# Patient Record
Sex: Female | Born: 1965 | Race: Black or African American | Hispanic: No | Marital: Single | Smoking: Never smoker
Health system: Southern US, Community
[De-identification: ages and names within clinical notes are randomized; demographics above are authoritative.]

## PROBLEM LIST (undated history)

## (undated) DIAGNOSIS — J449 Chronic obstructive pulmonary disease, unspecified: Secondary | ICD-10-CM

## (undated) DIAGNOSIS — I10 Essential (primary) hypertension: Secondary | ICD-10-CM

## (undated) DIAGNOSIS — M109 Gout, unspecified: Secondary | ICD-10-CM

## (undated) DIAGNOSIS — G4730 Sleep apnea, unspecified: Secondary | ICD-10-CM

## (undated) DIAGNOSIS — J45909 Unspecified asthma, uncomplicated: Secondary | ICD-10-CM

## (undated) DIAGNOSIS — G629 Polyneuropathy, unspecified: Secondary | ICD-10-CM

## (undated) DIAGNOSIS — I639 Cerebral infarction, unspecified: Secondary | ICD-10-CM

## (undated) DIAGNOSIS — K219 Gastro-esophageal reflux disease without esophagitis: Secondary | ICD-10-CM

## (undated) DIAGNOSIS — E119 Type 2 diabetes mellitus without complications: Secondary | ICD-10-CM

## (undated) DIAGNOSIS — N3281 Overactive bladder: Secondary | ICD-10-CM

## (undated) HISTORY — PX: MOUTH SURGERY: SHX715

---

## 2001-11-22 ENCOUNTER — Emergency Department (HOSPITAL_COMMUNITY): Admission: EM | Admit: 2001-11-22 | Discharge: 2001-11-22 | Payer: Self-pay | Admitting: *Deleted

## 2004-08-29 ENCOUNTER — Inpatient Hospital Stay: Payer: Self-pay | Admitting: Anesthesiology

## 2004-08-29 ENCOUNTER — Other Ambulatory Visit: Payer: Self-pay

## 2004-09-07 ENCOUNTER — Ambulatory Visit: Payer: Self-pay

## 2005-04-19 ENCOUNTER — Emergency Department: Payer: Self-pay | Admitting: Emergency Medicine

## 2005-07-15 ENCOUNTER — Other Ambulatory Visit: Payer: Self-pay

## 2005-07-15 ENCOUNTER — Inpatient Hospital Stay: Payer: Self-pay | Admitting: Internal Medicine

## 2005-07-16 ENCOUNTER — Other Ambulatory Visit: Payer: Self-pay

## 2005-08-15 ENCOUNTER — Ambulatory Visit: Payer: Self-pay | Admitting: Specialist

## 2005-08-26 ENCOUNTER — Emergency Department: Payer: Self-pay | Admitting: Emergency Medicine

## 2005-09-06 ENCOUNTER — Emergency Department: Payer: Self-pay | Admitting: Emergency Medicine

## 2005-10-04 ENCOUNTER — Ambulatory Visit: Payer: Self-pay | Admitting: Family Medicine

## 2005-12-01 ENCOUNTER — Inpatient Hospital Stay: Payer: Self-pay | Admitting: Internal Medicine

## 2005-12-01 ENCOUNTER — Other Ambulatory Visit: Payer: Self-pay

## 2005-12-23 ENCOUNTER — Emergency Department: Payer: Self-pay | Admitting: Emergency Medicine

## 2005-12-23 ENCOUNTER — Other Ambulatory Visit: Payer: Self-pay

## 2006-03-14 ENCOUNTER — Emergency Department: Payer: Self-pay | Admitting: Emergency Medicine

## 2006-03-14 ENCOUNTER — Other Ambulatory Visit: Payer: Self-pay

## 2006-03-25 ENCOUNTER — Emergency Department: Payer: Self-pay | Admitting: Unknown Physician Specialty

## 2006-11-21 IMAGING — CR DG CHEST 1V PORT
1 series · 1 of 1 positions shown · non-contrast
Comparison: none

REASON FOR EXAM: pain
COMMENTS:

PROCEDURE:     DXR - DXR PORTABLE CHEST SINGLE VIEW  - December 23, 2005  [DATE]
RESULT:          The lungs are clear.  The cardiovascular structures are
unremarkable.

[view not recorded]
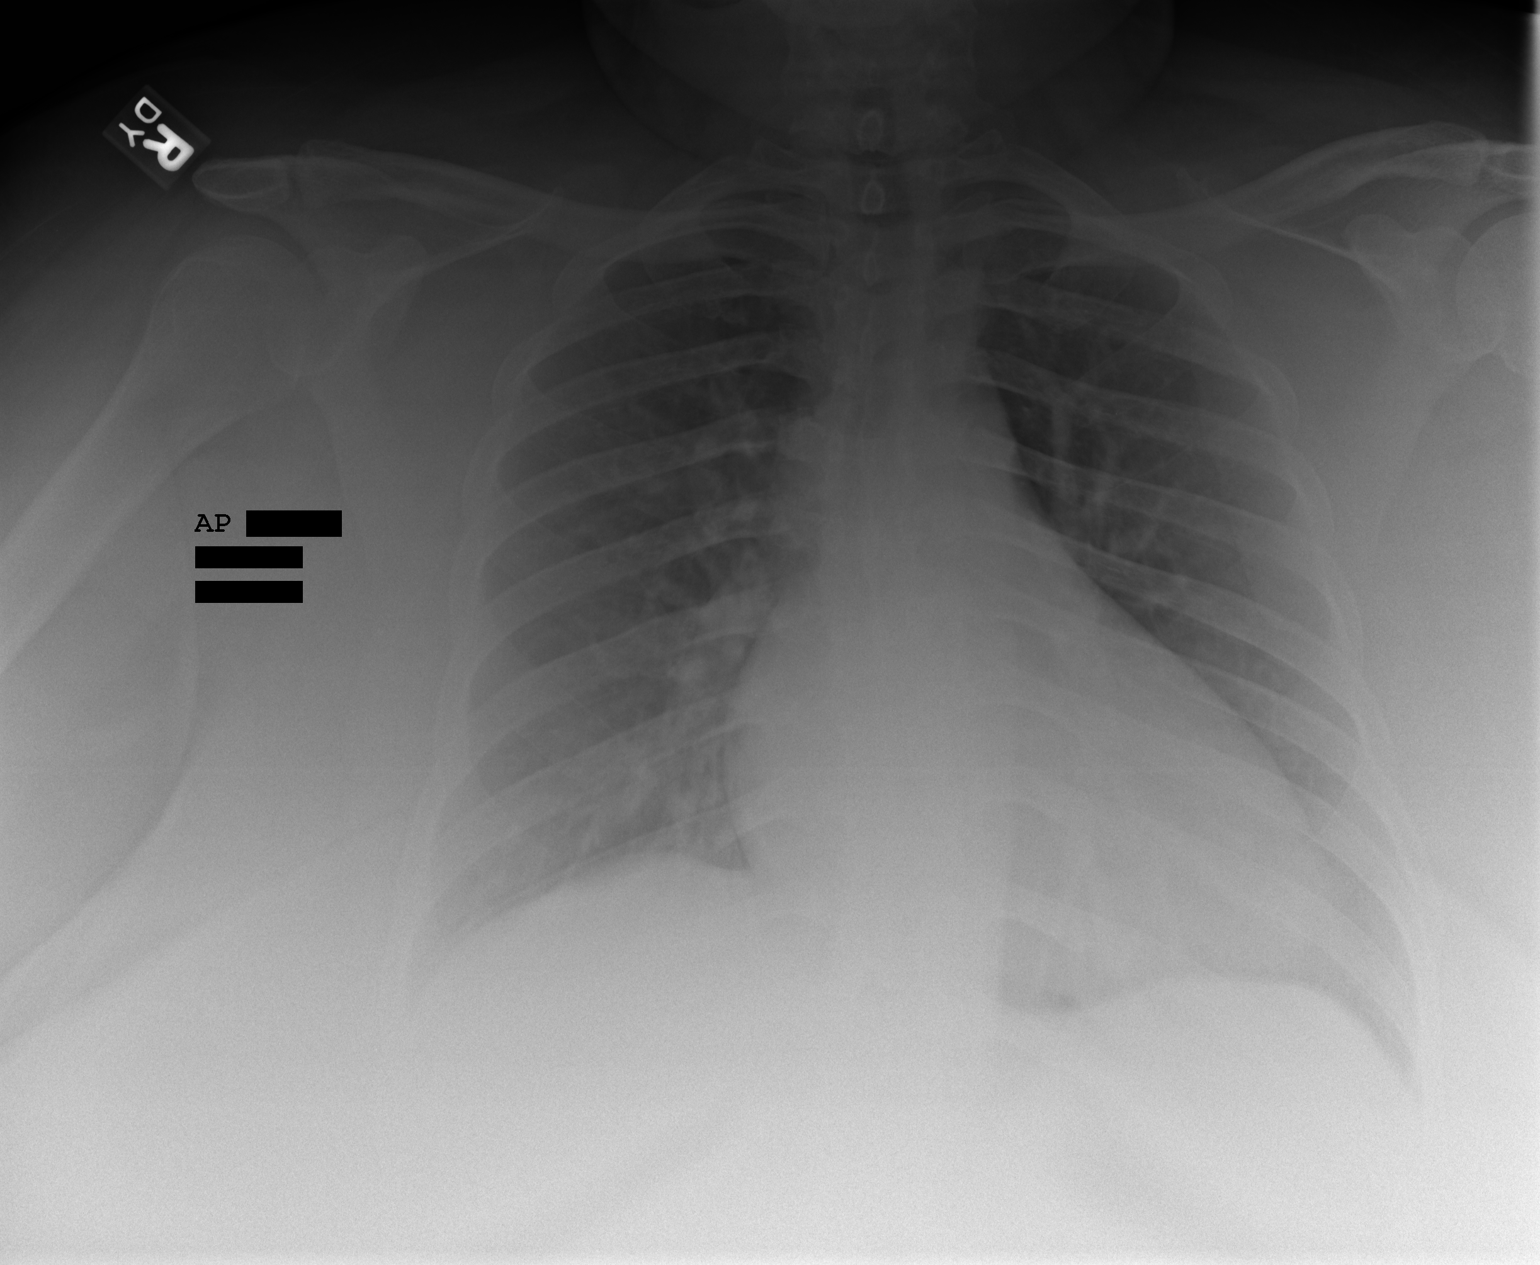

[1 of 1 positions shown; findings below may reference images not displayed]

IMPRESSION: No acute cardiopulmonary disease.

## 2006-12-18 IMAGING — US US OUTSIDE FILMS BREAST
1 series · 5 of 5 positions shown · non-contrast
Comparison: none

[Series 1: us outside films breast · 5 of 5 slices shown]
[im 1/5]
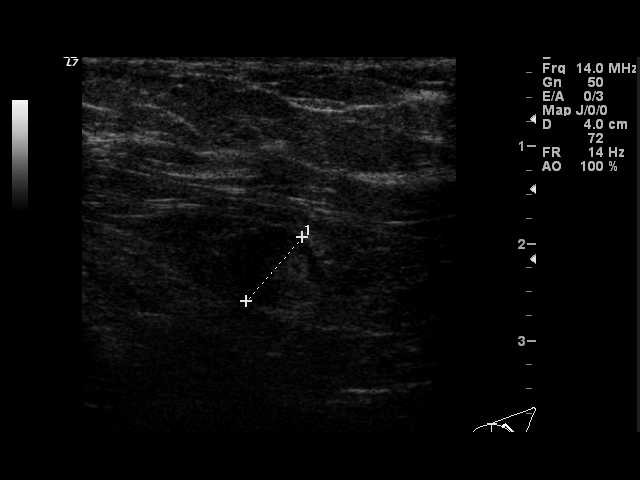
[im 2/5]
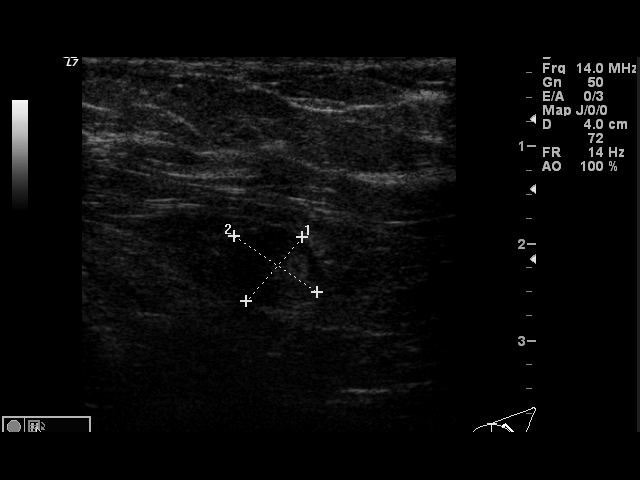
[im 3/5]
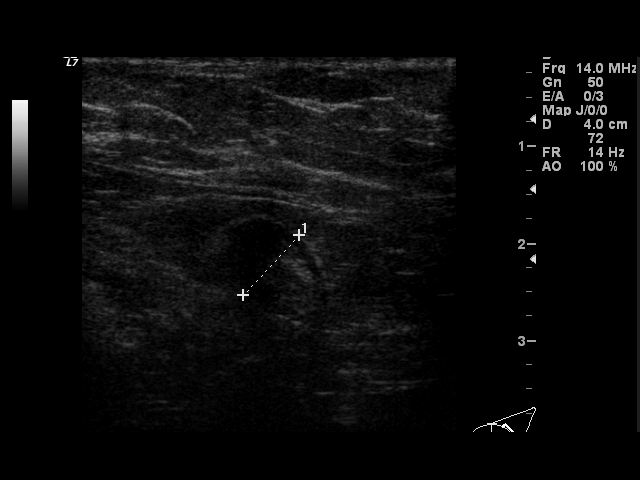
[im 4/5]
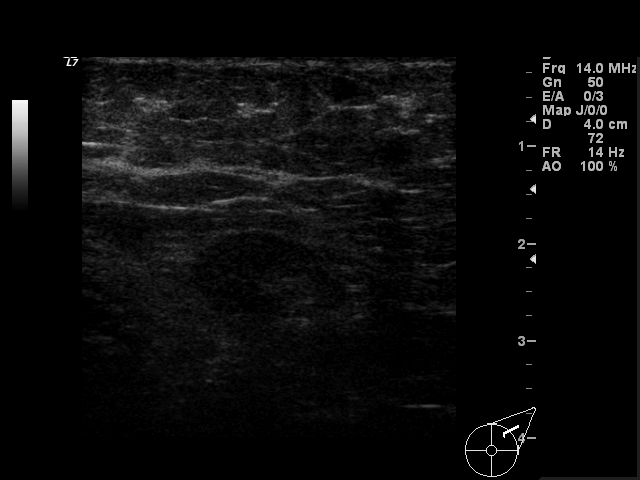
[im 5/5]
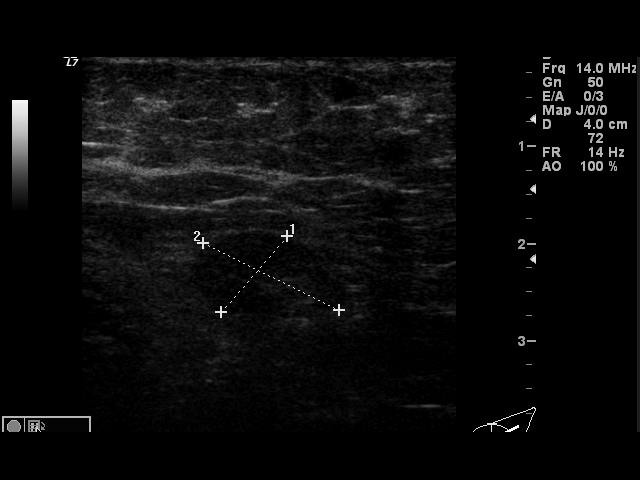

[5 of 5 positions shown; findings below may reference images not displayed]

IMAGES IMPORTED FROM THE [employer] WORKFLOW SYSTEM
NO DICTATION FOR STUDY

## 2007-01-30 ENCOUNTER — Emergency Department: Payer: Self-pay | Admitting: Emergency Medicine

## 2007-02-04 ENCOUNTER — Emergency Department: Payer: Self-pay | Admitting: Emergency Medicine

## 2007-02-04 ENCOUNTER — Other Ambulatory Visit: Payer: Self-pay

## 2007-06-25 ENCOUNTER — Emergency Department: Payer: Self-pay | Admitting: Emergency Medicine

## 2007-06-25 ENCOUNTER — Other Ambulatory Visit: Payer: Self-pay

## 2007-06-27 ENCOUNTER — Ambulatory Visit: Payer: Self-pay | Admitting: Internal Medicine

## 2007-12-26 ENCOUNTER — Ambulatory Visit: Payer: Self-pay | Admitting: Internal Medicine

## 2008-02-08 ENCOUNTER — Emergency Department: Payer: Self-pay | Admitting: Unknown Physician Specialty

## 2008-03-16 ENCOUNTER — Ambulatory Visit: Payer: Self-pay

## 2009-02-01 ENCOUNTER — Emergency Department: Payer: Self-pay | Admitting: Emergency Medicine

## 2010-02-22 ENCOUNTER — Observation Stay: Payer: Self-pay | Admitting: Internal Medicine

## 2010-02-22 ENCOUNTER — Ambulatory Visit: Payer: Self-pay | Admitting: Cardiovascular Disease

## 2010-02-24 ENCOUNTER — Encounter: Payer: Self-pay | Admitting: Cardiovascular Disease

## 2010-04-07 ENCOUNTER — Emergency Department: Payer: Self-pay | Admitting: Emergency Medicine

## 2010-04-08 ENCOUNTER — Emergency Department: Payer: Self-pay | Admitting: Emergency Medicine

## 2010-08-23 NOTE — Letter (Signed)
Summary: Discharge Summary  Discharge Summary   Imported By: [place] Carbo 02/25/2010 09:20:37  _____________________________________________________________________  External Attachment:    Type:   Image     Comment:   External Document

## 2010-09-01 ENCOUNTER — Emergency Department: Payer: Self-pay | Admitting: Emergency Medicine

## 2011-08-15 ENCOUNTER — Ambulatory Visit: Payer: Self-pay | Admitting: Family Medicine

## 2012-12-18 ENCOUNTER — Ambulatory Visit: Payer: Self-pay

## 2013-07-28 ENCOUNTER — Ambulatory Visit: Payer: Self-pay | Admitting: Internal Medicine

## 2013-11-09 DIAGNOSIS — G4733 Obstructive sleep apnea (adult) (pediatric): Secondary | ICD-10-CM | POA: Insufficient documentation

## 2013-11-09 DIAGNOSIS — J45909 Unspecified asthma, uncomplicated: Secondary | ICD-10-CM | POA: Insufficient documentation

## 2014-07-29 ENCOUNTER — Ambulatory Visit: Payer: Self-pay | Admitting: Internal Medicine

## 2015-11-17 ENCOUNTER — Other Ambulatory Visit: Payer: Self-pay | Admitting: Internal Medicine

## 2015-11-17 DIAGNOSIS — Z1231 Encounter for screening mammogram for malignant neoplasm of breast: Secondary | ICD-10-CM

## 2015-11-25 ENCOUNTER — Ambulatory Visit
Admission: RE | Admit: 2015-11-25 | Discharge: 2015-11-25 | Disposition: A | Discharge status: Home / Self Care | Payer: Medicare Other | Source: Ambulatory Visit | Attending: Internal Medicine | Admitting: Internal Medicine

## 2015-11-25 DIAGNOSIS — Z1231 Encounter for screening mammogram for malignant neoplasm of breast: Secondary | ICD-10-CM | POA: Diagnosis not present

## 2017-04-06 ENCOUNTER — Emergency Department: Payer: Medicare Other

## 2017-04-06 ENCOUNTER — Encounter: Payer: Self-pay | Admitting: Emergency Medicine

## 2017-04-06 ENCOUNTER — Emergency Department
Admission: EM | Admit: 2017-04-06 | Discharge: 2017-04-07 | Disposition: A | Discharge status: Other Acute Inpatient Hospital | Payer: Medicare Other | Attending: Emergency Medicine | Admitting: Emergency Medicine

## 2017-04-06 DIAGNOSIS — J45909 Unspecified asthma, uncomplicated: Secondary | ICD-10-CM | POA: Diagnosis not present

## 2017-04-06 DIAGNOSIS — I10 Essential (primary) hypertension: Secondary | ICD-10-CM | POA: Insufficient documentation

## 2017-04-06 DIAGNOSIS — R079 Chest pain, unspecified: Secondary | ICD-10-CM

## 2017-04-06 DIAGNOSIS — J449 Chronic obstructive pulmonary disease, unspecified: Secondary | ICD-10-CM | POA: Diagnosis not present

## 2017-04-06 HISTORY — DX: Gout, unspecified: M10.9

## 2017-04-06 HISTORY — DX: Chronic obstructive pulmonary disease, unspecified: J44.9

## 2017-04-06 HISTORY — DX: Essential (primary) hypertension: I10

## 2017-04-06 HISTORY — DX: Sleep apnea, unspecified: G47.30

## 2017-04-06 HISTORY — DX: Unspecified asthma, uncomplicated: J45.909

## 2017-04-06 LAB — CBC WITH DIFFERENTIAL/PLATELET
BASOS PCT: 1 %
Basophils Absolute: 0.1 10*3/uL (ref 0–0.1)
Eosinophils Absolute: 0.1 10*3/uL (ref 0–0.7)
Eosinophils Relative: 1 %
HEMATOCRIT: 34.4 % — AB (ref 35.0–47.0)
HEMOGLOBIN: 11.6 g/dL — AB (ref 12.0–16.0)
LYMPHS ABS: 2.2 10*3/uL (ref 1.0–3.6)
LYMPHS PCT: 28 %
MCH: 31.8 pg (ref 26.0–34.0)
MCHC: 33.8 g/dL (ref 32.0–36.0)
MCV: 93.9 fL (ref 80.0–100.0)
MONO ABS: 0.5 10*3/uL (ref 0.2–0.9)
MONOS PCT: 6 %
NEUTROS ABS: 5.2 10*3/uL (ref 1.4–6.5)
NEUTROS PCT: 64 %
Platelets: 216 10*3/uL (ref 150–440)
RBC: 3.66 MIL/uL — ABNORMAL LOW (ref 3.80–5.20)
RDW: 14.2 % (ref 11.5–14.5)
WBC: 8 10*3/uL (ref 3.6–11.0)

## 2017-04-06 LAB — COMPREHENSIVE METABOLIC PANEL
ALBUMIN: 3.2 g/dL — AB (ref 3.5–5.0)
ALK PHOS: 81 U/L (ref 38–126)
ALT: 11 U/L — ABNORMAL LOW (ref 14–54)
ANION GAP: 7 (ref 5–15)
AST: 19 U/L (ref 15–41)
BILIRUBIN TOTAL: 0.3 mg/dL (ref 0.3–1.2)
BUN: 10 mg/dL (ref 6–20)
CALCIUM: 8.7 mg/dL — AB (ref 8.9–10.3)
CO2: 27 mmol/L (ref 22–32)
Chloride: 103 mmol/L (ref 101–111)
Creatinine, Ser: 0.66 mg/dL (ref 0.44–1.00)
GFR calc Af Amer: >60 mL/min (ref >60)
GLUCOSE: 137 mg/dL — AB (ref 65–99)
POTASSIUM: 4.5 mmol/L (ref 3.5–5.1)
Sodium: 137 mmol/L (ref 135–145)
Total Protein: 7.3 g/dL (ref 6.5–8.1)

## 2017-04-06 LAB — TROPONIN I

## 2017-04-06 LAB — FIBRIN DERIVATIVES D-DIMER (ARMC ONLY): Fibrin derivatives D-dimer (ARMC): 500.9 — ABNORMAL HIGH (ref 0.00–499.00)

## 2017-04-06 MED ORDER — MORPHINE SULFATE (PF) 4 MG/ML IV SOLN
4.0000 mg | Freq: Once | INTRAVENOUS | Status: AC
  Administered 2017-04-06: 4 mg via INTRAVENOUS
  Filled 2017-04-06: qty 1

## 2017-04-06 MED ORDER — IPRATROPIUM-ALBUTEROL 0.5-2.5 (3) MG/3ML IN SOLN
3.0000 mL | Freq: Once | RESPIRATORY_TRACT | Status: AC
  Administered 2017-04-06: 3 mL via RESPIRATORY_TRACT
  Filled 2017-04-06: qty 3

## 2017-04-06 MED ORDER — ONDANSETRON HCL 4 MG/2ML IJ SOLN
4.0000 mg | Freq: Once | INTRAMUSCULAR | Status: AC
  Administered 2017-04-06: 4 mg via INTRAVENOUS
  Filled 2017-04-06: qty 2

## 2017-04-06 MED ORDER — IOPAMIDOL (ISOVUE-370) INJECTION 76%
100.0000 mL | Freq: Once | INTRAVENOUS | Status: AC | PRN
  Administered 2017-04-06: 100 mL via INTRAVENOUS

## 2017-04-06 NOTE — ED Provider Notes (Signed)
[facility] Emergency Department Provider Note    First MD Initiated Contact with Patient 04/06/17 1930     (approximate)  I have reviewed the triage vital signs and the nursing notes.   HISTORY  Chief Complaint Chest Pain    HPI Caitlin Hardin is a 51 y.o. female with history of COPD and asthma presents with chief complaint of chest pain that started just after dinner tonight. Patient states the pain is in the middle of her chest and nonradiating. Denies any shortness of breath. No cough. No nausea or vomiting. No fevers. No previous history of pain like this before. Does have a history of hypertension and gout. Lower extremity swelling. Actually denies any chest pain at this moment but when palpating her chest she states that she does have pain at that time.   Past Medical History:  Diagnosis Date  . Asthma   . COPD (chronic obstructive pulmonary disease) (HCC)   . Gout   . Hypertension   . Sleep apnea    Family History  Problem Relation Age of Onset  . Breast cancer Paternal Grandmother    History reviewed. No pertinent surgical history. There are no active problems to display for this patient.     Prior to Admission medications   Not on File    Allergies Patient has no allergy information on record.    Social History Social History  Substance Use Topics  . Smoking status: Never Smoker  . Smokeless tobacco: Never Used  . Alcohol use No    Review of Systems Patient denies headaches, rhinorrhea, blurry vision, numbness, shortness of breath, chest pain, edema, cough, abdominal pain, nausea, vomiting, diarrhea, dysuria, fevers, rashes or hallucinations unless otherwise stated above in HPI. ____________________________________________   PHYSICAL EXAM:  VITAL SIGNS: Vitals:   04/06/17 1940 04/06/17 2030  BP: (!) 118/49 (!) 116/54  Pulse: 65 65  Resp: 16 16  Temp: 98.3 F (36.8 C)   SpO2: 98% 96%    Constitutional:  Alert and oriented. Well appearing and in no acute distress. Eyes: Conjunctivae are normal.  Head: Atraumatic. Nose: No congestion/rhinnorhea. Mouth/Throat: Mucous membranes are moist.   Neck: No stridor. Painless ROM.  Cardiovascular: Normal rate, regular rhythm. Grossly normal heart sounds.  Good peripheral circulation. Respiratory: Normal respiratory effort.  No retractions. Lungs with intermittent wheeze Gastrointestinal: Soft and nontender. No distention. No abdominal bruits. No CVA tenderness. Musculoskeletal: No lower extremity tenderness nor edema.  No joint effusions. Neurologic:   No gross focal neurologic deficits are appreciated. No facial droop Skin:  Skin is warm, dry and intact. No rash noted. Psychiatric: Mood and affect are normal. Speech and behavior are normal.   ____________________________________________   LABS (all labs ordered are listed, but only abnormal results are displayed)  Results for orders placed or performed during the hospital encounter of 04/06/17 (from the past 24 hour(s))  CBC with Differential/Platelet     Status: Abnormal   Collection Time: 04/06/17  7:57 PM  Result Value Ref Range   WBC 8.0 3.6 - 11.0 K/uL   RBC 3.66 (L) 3.80 - 5.20 MIL/uL   Hemoglobin 11.6 (L) 12.0 - 16.0 g/dL   HCT 16.1 (L) 09.6 - 04.5 %   MCV 93.9 80.0 - 100.0 fL   MCH 31.8 26.0 - 34.0 pg   MCHC 33.8 32.0 - 36.0 g/dL   RDW 40.9 81.1 - 91.4 %   Platelets 216 150 - 440 K/uL   Neutrophils Relative % 64 %  Neutro Abs 5.2 1.4 - 6.5 K/uL   Lymphocytes Relative 28 %   Lymphs Abs 2.2 1.0 - 3.6 K/uL   Monocytes Relative 6 %   Monocytes Absolute 0.5 0.2 - 0.9 K/uL   Eosinophils Relative 1 %   Eosinophils Absolute 0.1 0 - 0.7 K/uL   Basophils Relative 1 %   Basophils Absolute 0.1 0 - 0.1 K/uL  Comprehensive metabolic panel     Status: Abnormal   Collection Time: 04/06/17  7:57 PM  Result Value Ref Range   Sodium 137 135 - 145 mmol/L   Potassium 4.5 3.5 - 5.1 mmol/L    Chloride 103 101 - 111 mmol/L   CO2 27 22 - 32 mmol/L   Glucose, Bld 137 (H) 65 - 99 mg/dL   BUN 10 6 - 20 mg/dL   Creatinine, Ser 6.96 0.44 - 1.00 mg/dL   Calcium 8.7 (L) 8.9 - 10.3 mg/dL   Total Protein 7.3 6.5 - 8.1 g/dL   Albumin 3.2 (L) 3.5 - 5.0 g/dL   AST 19 15 - 41 U/L   ALT 11 (L) 14 - 54 U/L   Alkaline Phosphatase 81 38 - 126 U/L   Total Bilirubin 0.3 0.3 - 1.2 mg/dL   GFR calc non Af Amer >60 >60 mL/min   GFR calc Af Amer >60 >60 mL/min   Anion gap 7 5 - 15  Troponin I     Status: None   Collection Time: 04/06/17  7:57 PM  Result Value Ref Range   Troponin I <0.03 <0.03 ng/mL  Fibrin derivatives D-Dimer ([facility] only)     Status: Abnormal   Collection Time: 04/06/17  8:23 PM  Result Value Ref Range   Fibrin derivatives D-dimer ([facility]) 500.90 (H) 0.00 - 499.00   ____________________________________________  EKG My review and personal interpretation at Time:  19:34   Indication: chest pain  Rate: 65  Rhythm: sinus Axis: normal Other: normal intervals, no stemi, no pr depressions ____________________________________________  RADIOLOGY  I personally reviewed all radiographic images ordered to evaluate for the above acute complaints and reviewed radiology reports and findings.  These findings were personally discussed with the patient.  Please see medical record for radiology report.  ____________________________________________   PROCEDURES  Procedure(s) performed:  Procedures    Critical Care performed: no ____________________________________________   INITIAL IMPRESSION / ASSESSMENT AND PLAN / ED COURSE  Pertinent labs & imaging results that were available during my care of the patient were reviewed by me and considered in my medical decision making (see chart for details).  DDX: ACS, pericarditis, esophagitis, boerhaaves, pe, dissection, pna, bronchitis, costochondritis   Caitlin Hardin is a 51 y.o. who presents to the ED with chest discomfort as  described above. She seemed dynamically stable and well-appearing. EKG is normal with no evidence of ischemia. Patient with a heart score of 2.  does have some wheezing on exam does have a history of COPD therefore we'll give DuoNeb and treat for presumed bronchitis.  she has no tachycardia and hypoxia therefore do not believe this clinically consistent with pulmonary embolism, she is low risk wells will order D-dimer to further eval.  Clinical Course as of Apr 06 2116  Fri Apr 06, 2017  2113 Pulse Rate: 65 [PR]    Clinical Course User Index [PR] Willy Eddy, MD   ----------------------------------------- 9:15 PM on 04/06/2017 -----------------------------------------  Troponin is negative but her d-dimer is elevated therefore will order CT angiogram to evaluate for PE.  patient  be signed out to Dr. Juliette Alcide any results of CT angiogram and repeat troponin.  ____________________________________________   FINAL CLINICAL IMPRESSION(S) / ED DIAGNOSES  Final diagnoses:  Chest pain, unspecified type      NEW MEDICATIONS STARTED DURING THIS VISIT:  New Prescriptions   No medications on file     Note:  This document was prepared using Dragon voice recognition software and may include unintentional dictation errors.    Willy Eddy, MD 04/06/17 2117

## 2017-04-06 NOTE — ED Notes (Signed)
Pt returned from CT °

## 2017-04-06 NOTE — ED Triage Notes (Signed)
Pt presents to ED via EMS from [facility] c/o chest pain that started "after supper" Pt is awake, alert and oriented x4; pt states pain at 9/10 in the top left chest. Pt was able to ambulate from the EMS stretcher on to the ED stretcher.

## 2017-04-06 NOTE — ED Provider Notes (Signed)
IMPRESSION: 1.  No demonstrable pulmonary embolus.  2.  Mild bibasilar lung scarring.  No edema or consolidation.  3. Chronic and stable axillary lymph node enlargement. Etiology uncertain. No other adenopathy evident.  4.  Minimal coronary artery calcification noted.   Electronically Signed   By: Bretta Bang III M.D.   On: 04/06/2017 22:01   Arnaldo Natal, MD 04/06/17 2242

## 2017-04-07 LAB — TROPONIN I: Troponin I: <0.03 ng/mL (ref <0.03)

## 2017-04-07 NOTE — ED Notes (Signed)

## 2017-04-07 NOTE — ED Notes (Signed)
After informing the patient that I was unable to reach anyone at [facility] pt states to call [employer] and ask for [employer]. [employer] called and asked for Jimmye Norman, the dispatch operator for the taxi service stated he will send a cab to come and pick up the patient and transport her to [facility], and they will charge them directly. Patient informed of this, and discharged to ED lobby to wait for the cab. ETA for cab provided by dispatcher was 10-15 minutes.

## 2017-04-07 NOTE — ED Provider Notes (Signed)
Clinical Course as of Apr 07 128  Fri Apr 06, 2017  2113 Pulse Rate: 65 [PR]  Sat Apr 07, 2017  0001 Assuming care from Dr. Darnelle Catalan.  In short, Caitlin Hardin is a 51 y.o. female with a chief complaint of chest pain.  Refer to the original H&P for additional details.  The current plan of care is to check second troponin and reassess.   [CF]  0128 the patient is resting comfortably.  Her repeat troponin was negative.  She is not hurting at this time and is comfortable with the plan for outpatient follow-up.  [CF]    Clinical Course User Index [CF] Caitlin Makenli, MD [PR] Willy Eddy, MD      Caitlin Kitana, MD 04/07/17 [phone]

## 2017-04-07 NOTE — ED Notes (Signed)
Pt assisted to ambulate from the stretcher to the commode and back to the stretcher; pt displays a steady, even gait. Pt does not appear to be in acute distress at this time.

## 2017-04-07 NOTE — Discharge Instructions (Signed)

## 2017-04-07 NOTE — ED Notes (Signed)
Attempted to call [facility] to transport discharged patient. Phone number obtained from the patient. Not able to reach anyone, as no one is answering the phone. Unable to leave a message, option to leave voicemail not available.

## 2017-10-08 ENCOUNTER — Encounter: Payer: Self-pay | Admitting: Emergency Medicine

## 2017-10-08 ENCOUNTER — Emergency Department: Payer: Medicare Other

## 2017-10-08 ENCOUNTER — Emergency Department
Admission: EM | Admit: 2017-10-08 | Discharge: 2017-10-08 | Disposition: A | Discharge status: Nursing Home (Includes ICF) | Payer: Medicare Other | Attending: Emergency Medicine | Admitting: Emergency Medicine

## 2017-10-08 ENCOUNTER — Other Ambulatory Visit: Payer: Self-pay

## 2017-10-08 DIAGNOSIS — R1031 Right lower quadrant pain: Secondary | ICD-10-CM | POA: Diagnosis present

## 2017-10-08 DIAGNOSIS — J45909 Unspecified asthma, uncomplicated: Secondary | ICD-10-CM | POA: Diagnosis not present

## 2017-10-08 DIAGNOSIS — I10 Essential (primary) hypertension: Secondary | ICD-10-CM | POA: Insufficient documentation

## 2017-10-08 DIAGNOSIS — J449 Chronic obstructive pulmonary disease, unspecified: Secondary | ICD-10-CM | POA: Diagnosis not present

## 2017-10-08 DIAGNOSIS — R112 Nausea with vomiting, unspecified: Secondary | ICD-10-CM | POA: Insufficient documentation

## 2017-10-08 DIAGNOSIS — R109 Unspecified abdominal pain: Secondary | ICD-10-CM

## 2017-10-08 LAB — COMPREHENSIVE METABOLIC PANEL
ALBUMIN: 3.2 g/dL — AB (ref 3.5–5.0)
ALT: 11 U/L — ABNORMAL LOW (ref 14–54)
AST: 24 U/L (ref 15–41)
Alkaline Phosphatase: 78 U/L (ref 38–126)
Anion gap: 11 (ref 5–15)
BUN: 10 mg/dL (ref 6–20)
CHLORIDE: 98 mmol/L — AB (ref 101–111)
CO2: 22 mmol/L (ref 22–32)
Calcium: 8.4 mg/dL — ABNORMAL LOW (ref 8.9–10.3)
Creatinine, Ser: 0.65 mg/dL (ref 0.44–1.00)
GFR calc Af Amer: >60 mL/min (ref >60)
GFR calc non Af Amer: >60 mL/min (ref >60)
GLUCOSE: 112 mg/dL — AB (ref 65–99)
POTASSIUM: 4.5 mmol/L (ref 3.5–5.1)
SODIUM: 131 mmol/L — AB (ref 135–145)
Total Bilirubin: 0.6 mg/dL (ref 0.3–1.2)
Total Protein: 7.9 g/dL (ref 6.5–8.1)

## 2017-10-08 LAB — CBC
HEMATOCRIT: 37.1 % (ref 35.0–47.0)
Hemoglobin: 12.2 g/dL (ref 12.0–16.0)
MCH: 30.6 pg (ref 26.0–34.0)
MCHC: 32.8 g/dL (ref 32.0–36.0)
MCV: 93.3 fL (ref 80.0–100.0)
Platelets: 259 10*3/uL (ref 150–440)
RBC: 3.97 MIL/uL (ref 3.80–5.20)
RDW: 13.7 % (ref 11.5–14.5)
WBC: 9.7 10*3/uL (ref 3.6–11.0)

## 2017-10-08 LAB — URINALYSIS, COMPLETE (UACMP) WITH MICROSCOPIC
BACTERIA UA: NONE SEEN
BILIRUBIN URINE: NEGATIVE
Glucose, UA: NEGATIVE mg/dL
Hgb urine dipstick: NEGATIVE
KETONES UR: NEGATIVE mg/dL
LEUKOCYTES UA: NEGATIVE
Nitrite: NEGATIVE
PROTEIN: NEGATIVE mg/dL
RBC / HPF: NONE SEEN RBC/hpf (ref 0–5)
SPECIFIC GRAVITY, URINE: 1.019 (ref 1.005–1.030)
pH: 5 (ref 5.0–8.0)

## 2017-10-08 LAB — LIPASE, BLOOD: LIPASE: 28 U/L (ref 11–51)

## 2017-10-08 NOTE — ED Notes (Addendum)
Daphne from [facility] called to check up. Call her back with update at her cell [phone]

## 2017-10-08 NOTE — ED Provider Notes (Signed)
[facility] Emergency Department Provider Note  ___________________________________________   First MD Initiated Contact with Patient 10/08/17 1808     (approximate)  I have reviewed the triage vital signs and the nursing notes.   HISTORY  Chief Complaint Abdominal Pain   HPI Caitlin Hardin is a 52 y.o. female with a history of asthma, COPD as well as sleep apnea was presenting to the emergency department with right sided flank and abdominal pain over the past 3-4 days. Says the pain is a 9 out of 10 right now and is aching. She says the pain is been constant over the past several days. Says that she vomited after breakfast this morning but then ate fish for lunch and was able to keep it down. Reports mild nausea at this time but no further vomiting. Denies any diarrhea. Says that she still has her gallbladder as well as her appendix.  No known history of kidney stones.says that she does have slight pain with urination.says that she was asked by staff tonight to go to the emergency department for further evaluation.  Past Medical History:  Diagnosis Date  . Asthma   . COPD (chronic obstructive pulmonary disease) (HCC)   . Gout   . Hypertension   . Sleep apnea     There are no active problems to display for this patient.   History reviewed. No pertinent surgical history.  Prior to Admission medications   Not on File    Allergies Patient has no known allergies.  Family History  Problem Relation Age of Onset  . Breast cancer Paternal Grandmother     Social History Social History   Tobacco Use  . Smoking status: Never Smoker  . Smokeless tobacco: Never Used  Substance Use Topics  . Alcohol use: No  . Drug use: No    Review of Systems  Constitutional: No fever/chills Eyes: No visual changes. ENT: No sore throat. Cardiovascular: Denies chest pain. Respiratory: Denies shortness of breath. Gastrointestinal: No diarrhea.  No  constipation. Genitourinary: Negative for dysuria. Musculoskeletal: right-sided flank pain. Skin: Negative for rash. Neurological: Negative for headaches, focal weakness or numbness.   ____________________________________________   PHYSICAL EXAM:  VITAL SIGNS: ED Triage Vitals  Enc Vitals Group     BP 10/08/17 1703 (!) 126/49     Pulse Rate 10/08/17 1703 73     Resp 10/08/17 1703 18     Temp 10/08/17 1703 98.1 F (36.7 C)     Temp Source 10/08/17 1703 Oral     SpO2 10/08/17 1703 94 %     Weight 10/08/17 1705 (!) 311 lb (141.1 kg)     Height 10/08/17 1705 5' (1.524 m)     Head Circumference --      Peak Flow --      Pain Score 10/08/17 1705 9     Pain Loc --      Pain Edu? --      Excl. in GC? --    Constitutional: Alert and oriented. Well appearing and in no acute distress. Eyes: Conjunctivae are normal.  Head: Atraumatic. Nose: No congestion/rhinnorhea. Mouth/Throat: Mucous membranes are moist.  Neck: No stridor.   Cardiovascular: Normal rate, regular rhythm. Grossly normal heart sounds.  Good peripheral circulation. Respiratory: Normal respiratory effort.  No retractions. Lungs CTAB. Gastrointestinal: Soft with mild right upper as well as right lower quadrant tenderness. Patient with a negative Murphy sign. Very minimal left upper and left lower quadrant tenderness without rebound or  guarding. No distention. No CVA tenderness. Musculoskeletal: No lower extremity tenderness nor edema.  No joint effusions. Neurologic:  Normal speech and language. No gross focal neurologic deficits are appreciated. Skin:  Skin is warm, dry and intact. No rash noted. Psychiatric: Mood and affect are normal. Speech and behavior are normal.  ____________________________________________   LABS (all labs ordered are listed, but only abnormal results are displayed)  Labs Reviewed  COMPREHENSIVE METABOLIC PANEL - Abnormal; Notable for the following components:      Result Value   Sodium  131 (*)    Chloride 98 (*)    Glucose, Bld 112 (*)    Calcium 8.4 (*)    Albumin 3.2 (*)    ALT 11 (*)    All other components within normal limits  URINALYSIS, COMPLETE (UACMP) WITH MICROSCOPIC - Abnormal; Notable for the following components:   Color, Urine YELLOW (*)    APPearance HAZY (*)    Squamous Epithelial / LPF 6-30 (*)    All other components within normal limits  LIPASE, BLOOD  CBC   ____________________________________________  EKG   ____________________________________________  RADIOLOGY  CT renal without any acute pathology. ____________________________________________   PROCEDURES  Procedure(s) performed:   Procedures  Critical Care performed:   ____________________________________________   INITIAL IMPRESSION / ASSESSMENT AND PLAN / ED COURSE  Pertinent labs & imaging results that were available during my care of the patient were reviewed by me and considered in my medical decision making (see chart for details).  Differential diagnosis includes, but is not limited to, ovarian cyst, ovarian torsion, acute appendicitis, diverticulitis, urinary tract infection/pyelonephritis, endometriosis, bowel obstruction, colitis, renal colic, gastroenteritis, hernia, fibroids, endometriosis, pregnancy related pain including ectopic pregnancy, etc. Differential diagnosis includes, but is not limited to, biliary disease (biliary colic, acute cholecystitis, cholangitis, choledocholithiasis, etc), intrathoracic causes for epigastric abdominal pain including ACS, gastritis, duodenitis, pancreatitis, small bowel or large bowel obstruction, abdominal aortic aneurysm, hernia, and gastritis. As part of my medical decision making, I reviewed the following data within the electronic MEDICAL RECORD NUMBER Notes from prior ED visits  ----------------------------------------- 7:35 PM on 10/08/2017 -----------------------------------------  Patient with very benign appearance as  well as reassuring blood work, urine and CAT scan. Patient will be discharged at this time. She is asking for ice and tolerating by mouth. Possibly viral etiology. Does not appear to be acute surgical pathology. patient with diagnoses of pulse treatment plan 1 to comply. ____________________________________________   FINAL CLINICAL IMPRESSION(S) / ED DIAGNOSES  Right-sided abdominal pain. Nausea and vomiting.    NEW MEDICATIONS STARTED DURING THIS VISIT:  New Prescriptions   No medications on file     Note:  This document was prepared using Dragon voice recognition software and may include unintentional dictation errors.     Myrna Blazer, MD 10/08/17 Barry Brunner

## 2017-10-08 NOTE — ED Triage Notes (Signed)
R abdominal pain x 4 days. Resident of [facility].

## 2017-10-08 NOTE — ED Notes (Signed)
Reviewed discharge instructions, follow-up care, and prescriptions with patient. Patient verbalized understanding of all information reviewed. Patient stable, with no distress noted at this time.    Spoke with facility rep Bard Herbertaphne, and supervisor Tammy and gave report on patient. Daphne and Tammy verbalized understanding of all information reviewed. Supervisor Tammy indicated that patient should be sent back EMS.

## 2017-11-19 ENCOUNTER — Emergency Department
Admission: EM | Admit: 2017-11-19 | Discharge: 2017-11-19 | Disposition: A | Discharge status: Skilled Nursing Facility | Payer: Medicare Other | Attending: Emergency Medicine | Admitting: Emergency Medicine

## 2017-11-19 ENCOUNTER — Other Ambulatory Visit: Payer: Self-pay

## 2017-11-19 ENCOUNTER — Encounter: Payer: Self-pay | Admitting: *Deleted

## 2017-11-19 ENCOUNTER — Emergency Department: Payer: Medicare Other

## 2017-11-19 DIAGNOSIS — W07XXXA Fall from chair, initial encounter: Secondary | ICD-10-CM | POA: Diagnosis not present

## 2017-11-19 DIAGNOSIS — Y92128 Other place in nursing home as the place of occurrence of the external cause: Secondary | ICD-10-CM | POA: Diagnosis not present

## 2017-11-19 DIAGNOSIS — Y939 Activity, unspecified: Secondary | ICD-10-CM | POA: Diagnosis not present

## 2017-11-19 DIAGNOSIS — Y999 Unspecified external cause status: Secondary | ICD-10-CM | POA: Insufficient documentation

## 2017-11-19 DIAGNOSIS — I10 Essential (primary) hypertension: Secondary | ICD-10-CM | POA: Insufficient documentation

## 2017-11-19 DIAGNOSIS — M545 Low back pain, unspecified: Secondary | ICD-10-CM

## 2017-11-19 DIAGNOSIS — J45909 Unspecified asthma, uncomplicated: Secondary | ICD-10-CM | POA: Insufficient documentation

## 2017-11-19 DIAGNOSIS — W19XXXA Unspecified fall, initial encounter: Secondary | ICD-10-CM

## 2017-11-19 MED ORDER — ETODOLAC 200 MG PO CAPS: 200.0000 mg | ORAL_CAPSULE | Freq: Three times a day (TID) | ORAL | 0 refills | Status: DC

## 2017-11-19 MED ORDER — OXYCODONE-ACETAMINOPHEN 5-325 MG PO TABS
2.0000 | ORAL_TABLET | Freq: Once | ORAL | Status: AC
  Administered 2017-11-19: 2 via ORAL
  Filled 2017-11-19: qty 2

## 2017-11-19 NOTE — ED Notes (Signed)
Pt reports from [facility], reports falling from a sitting position when the chair pt sat in "some plastic type" collapsed, pt reports pain at top of gluteal cleft worse when walking and palpation no broken skin or bruising evident, pt also c/o of mild pain to left knee (dry skin excoriation visible, pain to lateral aspect of right foot, appears slightly bruised but difficult to discern d/t to skin blemishes  Pt NAD, pleasantly interactive, standby assist to toilet

## 2017-11-19 NOTE — ED Notes (Signed)
Leann from [facility] contacted about patient's discharge condition.

## 2017-11-19 NOTE — ED Notes (Signed)
EMS arrived to take pt back to facility

## 2017-11-19 NOTE — ED Provider Notes (Signed)
[facility] Emergency Department Provider Note   ____________________________________________   First MD Initiated Contact with Patient 11/19/17 [phone]     (approximate)  I have reviewed the triage vital signs and the nursing notes.   HISTORY  Chief Complaint Fall    HPI Wing Jachelle Fluty is a 52 y.o. female who comes into the hospital today after a fall at her nursing home.  The patient was on her back porch between the hours of 730 and 8 PM.  She reports that she was in a plastic chair.  The chair broke and the patient fell onto her bottom.  She reports that she has a small bruise on her right ankle but she has lower back pain.  She was given ibuprofen at her nursing home but the pain persisted.  The patient rates her pain a 10 out of 10 in intensity so she was sent in for evaluation.  The patient has been urinating well.  She did not hit her head or pass out.  Past Medical History:  Diagnosis Date  . Asthma   . COPD (chronic obstructive pulmonary disease) (HCC)   . Gout   . Hypertension   . Sleep apnea     There are no active problems to display for this patient.   No past surgical history on file.  Prior to Admission medications   Medication Sig Start Date End Date Taking? Authorizing Provider  etodolac (LODINE) 200 MG capsule Take 1 capsule (200 mg total) by mouth every 8 (eight) hours. 11/19/17   Rebecka Apley, MD    Allergies Patient has no known allergies.  Family History  Problem Relation Age of Onset  . Breast cancer Paternal Grandmother     Social History Social History   Tobacco Use  . Smoking status: Never Smoker  . Smokeless tobacco: Never Used  Substance Use Topics  . Alcohol use: No  . Drug use: No    Review of Systems  Constitutional: No fever/chills Eyes: No visual changes. ENT: No sore throat. Cardiovascular: Denies chest pain. Respiratory: Denies shortness of breath. Gastrointestinal: No abdominal pain.     Genitourinary: Negative for dysuria. Musculoskeletal:  back pain. Skin: Negative for rash. Neurological: Negative for headaches,    ____________________________________________   PHYSICAL EXAM:  VITAL SIGNS: ED Triage Vitals  Enc Vitals Group     BP 11/19/17 0052 104/86     Pulse Rate 11/19/17 0052 63     Resp 11/19/17 0052 20     Temp 11/19/17 0052 98.2 F (36.8 C)     Temp Source 11/19/17 0052 Oral     SpO2 11/19/17 0052 96 %     Weight 11/19/17 0050 (!) 310 lb (140.6 kg)     Height 11/19/17 0050 5' (1.524 m)     Head Circumference --      Peak Flow --      Pain Score 11/19/17 0050 10     Pain Loc --      Pain Edu? --      Excl. in GC? --     Constitutional: Alert and oriented. Well appearing and in mild to moderate distress. Eyes: Conjunctivae are normal. PERRL. EOMI. Head: Atraumatic. Nose: No congestion/rhinnorhea. Mouth/Throat: Mucous membranes are moist.  Oropharynx non-erythematous. Cardiovascular: Normal rate, regular rhythm. Grossly normal heart sounds.  Good peripheral circulation. Respiratory: Normal respiratory effort.  No retractions. Lungs CTAB. Gastrointestinal: Soft and nontender. No distention.  Musculoskeletal: Tenderness to palpation over the midline lumbar  spine with  negative straight leg raise Neurologic:  Normal speech and language.  Skin:  Skin is warm, dry and intact.  Psychiatric: Mood and affect are normal.   ____________________________________________   LABS (all labs ordered are listed, but only abnormal results are displayed)  Labs Reviewed - No data to display ____________________________________________  EKG  none ____________________________________________  RADIOLOGY  ED MD interpretation: CT lumbar spine: Severe degenerative disc disease is noted at L1-2 no acute abnormality seen in the lumbar spine.  Official radiology report(s): Ct Lumbar Spine Wo Contrast  Result Date: 11/19/2017 CLINICAL DATA:  Lower back pain  after fall. EXAM: CT LUMBAR SPINE WITHOUT CONTRAST TECHNIQUE: Multidetector CT imaging of the lumbar spine was performed without intravenous contrast administration. Multiplanar CT image reconstructions were also generated. COMPARISON:  CT scan of October 08, 2017. FINDINGS: Segmentation: 5 lumbar type vertebrae. Alignment: Normal. Vertebrae: No acute fracture or focal pathologic process. Paraspinal and other soft tissues: Negative. Disc levels: Severe degenerative disc disease is noted at L1-2 with anterior posterior osteophyte formation. Remaining disc spaces are unremarkable. IMPRESSION: Severe degenerative disc disease is noted at L1-2. No acute abnormality seen in the lumbar spine. Electronically Signed   By: Lupita Raider, M.D.   On: 11/19/2017 07:06    ____________________________________________   PROCEDURES  Procedure(s) performed: None  Procedures  Critical Care performed: No  ____________________________________________   INITIAL IMPRESSION / ASSESSMENT AND PLAN / ED COURSE  As part of my medical decision making, I reviewed the following data within the electronic MEDICAL RECORD NUMBER Notes from prior ED visits and [place] Controlled Substance Database   This is a 52 year old female who comes into the hospital today after a fall at her nursing home.  I did send the patient for CT scan of her lumbar spine given her body habitus and her mechanism of injury.  The CT scan does not show any compression fractures or any acute injury.  The patient was given some Percocet for her pain.  She will be discharged to her nursing home and should follow-up with her primary care physician.      ____________________________________________   FINAL CLINICAL IMPRESSION(S) / ED DIAGNOSES  Final diagnoses:  Fall, initial encounter  Acute bilateral low back pain without sciatica     ED Discharge Orders        Ordered    etodolac (LODINE) 200 MG capsule  Every 8 hours     11/19/17 0755        Note:  This document was prepared using Dragon voice recognition software and may include unintentional dictation errors.    Rebecka Apley, MD 11/19/17 0830

## 2017-11-19 NOTE — ED Triage Notes (Signed)
Pt brought in via ems from [facility].  Pt was sitting in a chair on the porch and the chair broke causing pt to hit the floor.  Pt has low back pain. Pt alert.

## 2017-11-19 NOTE — Discharge Instructions (Addendum)
Please follow up with your primary care physician.

## 2018-02-08 ENCOUNTER — Other Ambulatory Visit: Payer: Self-pay | Admitting: Obstetrics & Gynecology

## 2018-02-08 DIAGNOSIS — Z1231 Encounter for screening mammogram for malignant neoplasm of breast: Secondary | ICD-10-CM

## 2018-03-01 ENCOUNTER — Ambulatory Visit
Admission: RE | Admit: 2018-03-01 | Discharge: 2018-03-01 | Disposition: A | Discharge status: Home / Self Care | Payer: Medicare Other | Source: Ambulatory Visit | Attending: Obstetrics & Gynecology | Admitting: Obstetrics & Gynecology

## 2018-03-01 DIAGNOSIS — Z1231 Encounter for screening mammogram for malignant neoplasm of breast: Secondary | ICD-10-CM | POA: Insufficient documentation

## 2018-09-10 ENCOUNTER — Emergency Department
Admission: EM | Admit: 2018-09-10 | Discharge: 2018-09-11 | Disposition: A | Discharge status: Skilled Nursing Facility | Payer: Medicare Other | Attending: Emergency Medicine | Admitting: Emergency Medicine

## 2018-09-10 ENCOUNTER — Other Ambulatory Visit: Payer: Self-pay

## 2018-09-10 ENCOUNTER — Emergency Department: Payer: Medicare Other

## 2018-09-10 DIAGNOSIS — R112 Nausea with vomiting, unspecified: Secondary | ICD-10-CM | POA: Diagnosis not present

## 2018-09-10 DIAGNOSIS — J449 Chronic obstructive pulmonary disease, unspecified: Secondary | ICD-10-CM | POA: Insufficient documentation

## 2018-09-10 DIAGNOSIS — I10 Essential (primary) hypertension: Secondary | ICD-10-CM | POA: Diagnosis not present

## 2018-09-10 DIAGNOSIS — R001 Bradycardia, unspecified: Secondary | ICD-10-CM | POA: Diagnosis not present

## 2018-09-10 DIAGNOSIS — R0789 Other chest pain: Secondary | ICD-10-CM | POA: Insufficient documentation

## 2018-09-10 LAB — URINALYSIS, COMPLETE (UACMP) WITH MICROSCOPIC
Bacteria, UA: NONE SEEN
Bilirubin Urine: NEGATIVE
Glucose, UA: NEGATIVE mg/dL
Hgb urine dipstick: NEGATIVE
Ketones, ur: NEGATIVE mg/dL
Leukocytes,Ua: NEGATIVE
NITRITE: NEGATIVE
PH: 5 (ref 5.0–8.0)
Protein, ur: NEGATIVE mg/dL
Specific Gravity, Urine: 1.029 (ref 1.005–1.030)

## 2018-09-10 LAB — COMPREHENSIVE METABOLIC PANEL
ALBUMIN: 3.7 g/dL (ref 3.5–5.0)
ALT: 24 U/L (ref 0–44)
AST: 30 U/L (ref 15–41)
Alkaline Phosphatase: 85 U/L (ref 38–126)
Anion gap: 10 (ref 5–15)
BUN: 14 mg/dL (ref 6–20)
CO2: 25 mmol/L (ref 22–32)
Calcium: 9.1 mg/dL (ref 8.9–10.3)
Chloride: 103 mmol/L (ref 98–111)
Creatinine, Ser: 0.88 mg/dL (ref 0.44–1.00)
GFR calc Af Amer: >60 mL/min (ref >60)
GFR calc non Af Amer: >60 mL/min (ref >60)
GLUCOSE: 141 mg/dL — AB (ref 70–99)
Potassium: 4.3 mmol/L (ref 3.5–5.1)
Sodium: 138 mmol/L (ref 135–145)
Total Bilirubin: 0.4 mg/dL (ref 0.3–1.2)
Total Protein: 7.7 g/dL (ref 6.5–8.1)

## 2018-09-10 LAB — CBC
HCT: 38.8 % (ref 36.0–46.0)
Hemoglobin: 12.5 g/dL (ref 12.0–15.0)
MCH: 30.9 pg (ref 26.0–34.0)
MCHC: 32.2 g/dL (ref 30.0–36.0)
MCV: 96 fL (ref 80.0–100.0)
Platelets: 217 10*3/uL (ref 150–400)
RBC: 4.04 MIL/uL (ref 3.87–5.11)
RDW: 13.6 % (ref 11.5–15.5)
WBC: 8.6 10*3/uL (ref 4.0–10.5)
nRBC: 0 % (ref 0.0–0.2)

## 2018-09-10 LAB — TROPONIN I
Troponin I: <0.03 ng/mL (ref <0.03)
Troponin I: <0.03 ng/mL (ref <0.03)

## 2018-09-10 LAB — LIPASE, BLOOD: Lipase: 30 U/L (ref 11–51)

## 2018-09-10 MED ORDER — ALUM & MAG HYDROXIDE-SIMETH 200-200-20 MG/5ML PO SUSP
30.0000 mL | Freq: Once | ORAL | Status: AC
  Administered 2018-09-10: 30 mL via ORAL
  Filled 2018-09-10: qty 30

## 2018-09-10 NOTE — ED Provider Notes (Signed)
[facility] Emergency Department Provider Note  ____________________________________________  Time seen: Approximately 8:46 PM  I have reviewed the triage vital signs and the nursing notes.   HISTORY  Chief Complaint Abdominal Pain and Gastroesophageal Reflux    HPI Caitlin Hardin is a 53 y.o. female with a history of HTN, COPD, reflux, resenting for one episode of nausea and vomiting, as well as central chest burning.  The patient reports that prior to arrival, she took all of her pills and then vomited a single time.  Since then, her nausea has completely resolved.  She is not been having any abdominal pain, constipation or diarrhea.  After throwing up, she developed a central chest burning sensation similar to prior GERD.  She is not having any shortness of breath, Palpitations, lightheadedness or syncope.  She has not had any recent lower extremity swelling, cough or cold symptoms, fevers or chills.  Past Medical History:  Diagnosis Date  . Asthma   . COPD (chronic obstructive pulmonary disease) (HCC)   . Gout   . Hypertension   . Sleep apnea     There are no active problems to display for this patient.   History reviewed. No pertinent surgical history.  Current Outpatient Rx  . Order #: [id number] Class: Historical Med  . Order #: [id number] Class: Historical Med  . Order #: [id number] Class: Historical Med  . Order #: [id number] Class: Historical Med  . Order #: [id number] Class: Historical Med  . Order #: [id number] Class: Historical Med  . Order #: [id number] Class: Historical Med  . Order #: [id number] Class: Historical Med  . Order #: [id number] Class: Historical Med  . Order #: [id number] Class: Historical Med  . Order #: [id number] Class: Historical Med  . Order #: [id number] Class: Historical Med  . Order #: [id number] Class: Historical Med  . Order #: [id number] Class: Historical Med  . Order #: [id number] Class: Historical Med  . Order #: [id number] Class:  Historical Med  . Order #: [id number] Class: Historical Med  . Order #: [id number] Class: Historical Med    Allergies Patient has no known allergies.  Family History  Problem Relation Age of Onset  . Breast cancer Paternal Grandmother     Social History Social History   Tobacco Use  . Smoking status: Never Smoker  . Smokeless tobacco: Never Used  Substance Use Topics  . Alcohol use: No  . Drug use: No    Review of Systems Constitutional: No fever/chills.  No lightheadedness or syncope.  No diaphoresis. Eyes: No visual changes. ENT: No sore throat. No congestion or rhinorrhea. Cardiovascular: Positive central chest burning sensation. Denies palpitations. Respiratory: Denies shortness of breath.  No cough. Gastrointestinal: No abdominal pain.  Positive single episode of nausea and vomiting, now resolved.  No diarrhea.  No constipation. Genitourinary: Negative for dysuria. Musculoskeletal: Negative for back pain. Skin: Negative for rash. Neurological: Negative for headaches. No focal numbness, tingling or weakness.     ____________________________________________   PHYSICAL EXAM:  VITAL SIGNS: ED Triage Vitals  Enc Vitals Group     BP 09/10/18 2032 129/72     Pulse Rate 09/10/18 2032 69     Resp 09/10/18 2032 (!) 25     Temp 09/10/18 2032 98.2 F (36.8 C)     Temp Source 09/10/18 2032 Oral     SpO2 09/10/18 2032 97 %     Weight 09/10/18 2029 (!) 302 lb (137 kg)     Height 09/10/18 2029 4\' 11"  (1.499 m)     Head Circumference --  Peak Flow --      Pain Score 09/10/18 2029 5     Pain Loc --      Pain Edu? --      Excl. in GC? --     Constitutional: Alert and oriented. Answers questions appropriately.  Tonically ill-appearing. Eyes: Conjunctivae are normal.  EOMI. No scleral icterus. Head: Atraumatic. Nose: No congestion/rhinnorhea. Mouth/Throat: Mucous membranes are moist.  Neck: No stridor.  Supple.  No JVD.  No meningismus. Cardiovascular: Normal  rate, regular rhythm. No murmurs, rubs or gallops.  Respiratory: Normal respiratory effort.  No accessory muscle use or retractions. Lungs CTAB.  No wheezes, rales or ronchi. Gastrointestinal: Morbidly obese.  Soft, nontender and nondistended.  No guarding or rebound.  No peritoneal signs. Musculoskeletal: No LE edema. No ttp in the calves or palpable cords.  Negative Homan's sign. Neurologic:  A&Ox3.  Speech is clear.  Facial paralysis consistent with prior Bell's palsy. EOMI.  Moves all extremities well. Skin:  Skin is warm, dry and intact. No rash noted. Psychiatric: Mood and affect are normal.   ____________________________________________   LABS (all labs ordered are listed, but only abnormal results are displayed)  Labs Reviewed  COMPREHENSIVE METABOLIC PANEL - Abnormal; Notable for the following components:      Result Value   Glucose, Bld 141 (*)    All other components within normal limits  URINALYSIS, COMPLETE (UACMP) WITH MICROSCOPIC - Abnormal; Notable for the following components:   Color, Urine YELLOW (*)    APPearance CLEAR (*)    All other components within normal limits  CBC  LIPASE, BLOOD  TROPONIN I  TROPONIN I   ____________________________________________  EKG  ED ECG REPORT I, Anne-Caroline Sharma Covert, the attending physician, personally viewed and interpreted this ECG.   Date: 09/10/2018  EKG Time: 2034  Rate: 58  Rhythm: sinus bradycardia  Axis: normal  Intervals:none  ST&T Change: No STEMi  ____________________________________________  RADIOLOGY  Dg Chest 2 View  Result Date: 09/10/2018 CLINICAL DATA:  Chest pain EXAM: CHEST - 2 VIEW COMPARISON:  04/06/2017 FINDINGS: The heart size and mediastinal contours are within normal limits. Both lungs are clear. The visualized skeletal structures are unremarkable. IMPRESSION: No active cardiopulmonary disease. Electronically Signed   By: Jasmine Pang M.D.   On: 09/10/2018 21:08     ____________________________________________   PROCEDURES  Procedure(s) performed: None  Procedures  Critical Care performed: No ____________________________________________   INITIAL IMPRESSION / ASSESSMENT AND PLAN / ED COURSE  Pertinent labs & imaging results that were available during my care of the patient were reviewed by me and considered in my medical decision making (see chart for details).  53 y.o. female with a history of GERD presenting with a single episode of nausea and vomiting after taking all of her medications, now resolved, with central chest burning typical of her prior GERD symptoms.  Overall, the patient is hemodynamically stable.  Her EKG is reassuring without any ischemic changes or arrhythmia.  She does have some risk factors, so we will get 2 troponins.  In the meantime, the patient will be treated with a GI cocktail and reevaluated for her symptoms.  She will also undergo p.o. challenge with clear liquid.  I do not see any evidence of acute severe infectious or surgical pathology on her abdominal examination.  Plan reevaluation for final disposition.  ----------------------------------------- 11:36 PM on 09/10/2018 -----------------------------------------  The patient continues to be asymptomatic from a nausea standpoint and is able to tolerate liquids without difficulty.  Her chest pain has resolved with a GI cocktail.  Her repeat troponin is negative.  At this time, the patient is safe for discharge home.  Follow-up instructions as well as return precautions were discussed.  ____________________________________________  FINAL CLINICAL IMPRESSION(S) / ED DIAGNOSES  Final diagnoses:  Non-intractable vomiting with nausea, unspecified vomiting type  Burning chest pain  Sinus bradycardia         NEW MEDICATIONS STARTED DURING THIS VISIT:  New Prescriptions   No medications on file      Rockne Menghini, MD 09/10/18 2336

## 2018-09-10 NOTE — Discharge Instructions (Signed)
Eat small regular healthy meals throughout the day to prevent pain and vomiting.  Make an appointment with your primary care physician for reevaluation.  Return to the emergency department if you develop severe pain, chest pain, shortness of breath, palpitations, lightheadedness or fainting, fever, or any other symptoms concerning to you.

## 2018-09-10 NOTE — ED Notes (Signed)
Pt given 12oz ice water at this time to assess pt's ability to tolerate PO intake per Dr Sharma Covert.

## 2018-09-10 NOTE — ED Triage Notes (Signed)
Pt to ED via EMS from [facility]. PT c/o burning sensation from throat to epigastric area. Pt medications indicative of hx of GERD. PT AO and in good spirits, laughing and joking, on arrival.

## 2018-09-10 NOTE — ED Notes (Signed)
Patient transported to X-ray at this time 

## 2018-09-10 NOTE — ED Notes (Signed)
PT denies any SOB, dizziness or pain radiating to arm jaw or back.

## 2018-09-11 NOTE — ED Notes (Signed)
EMS told Caitlin Hardin that the Pt's care giver Tammy [phone] called her to let her know that they are coming to pick up the Pt.

## 2018-09-11 NOTE — ED Notes (Signed)
Sherilyn Cooter RN called [facility] to give report and the staff (Tammy [phone]) said to sent the Pt via EMS since they were not able to pick up the Pt.

## 2018-09-11 NOTE — ED Notes (Signed)
Sherilyn Cooter RN and Sarah ED tech helped the Pt to the bedside commode to have a BM. Pt had a BM and returned to her bed. Pt has a lot of difficulty walking and is unsteady on her feet. Pt would not be able to ambulate on her own without falling.

## 2018-09-11 NOTE — ED Notes (Signed)
Pt's care giver Babette Relic [phone] called and spoke with Roberts Gaudy and told him that she could not find anyone to pickup the Pt and that it might be a While until she can come.

## 2020-10-26 ENCOUNTER — Other Ambulatory Visit: Payer: Self-pay | Admitting: Student

## 2020-10-26 ENCOUNTER — Other Ambulatory Visit: Payer: Self-pay | Admitting: Internal Medicine

## 2020-10-26 ENCOUNTER — Other Ambulatory Visit (HOSPITAL_COMMUNITY): Payer: Self-pay | Admitting: Internal Medicine

## 2020-10-26 DIAGNOSIS — I2089 Other forms of angina pectoris: Secondary | ICD-10-CM

## 2020-10-26 DIAGNOSIS — I208 Other forms of angina pectoris: Secondary | ICD-10-CM

## 2020-10-26 DIAGNOSIS — R0602 Shortness of breath: Secondary | ICD-10-CM

## 2020-11-03 ENCOUNTER — Ambulatory Visit: Payer: Medicare Other

## 2020-11-05 ENCOUNTER — Ambulatory Visit: Payer: Medicare Other

## 2020-11-10 ENCOUNTER — Other Ambulatory Visit: Payer: Self-pay

## 2020-11-10 ENCOUNTER — Ambulatory Visit
Admission: RE | Admit: 2020-11-10 | Discharge: 2020-11-10 | Disposition: A | Discharge status: Home / Self Care | Payer: Medicare Other | Source: Ambulatory Visit | Attending: Student | Admitting: Student

## 2020-11-10 DIAGNOSIS — I358 Other nonrheumatic aortic valve disorders: Secondary | ICD-10-CM | POA: Insufficient documentation

## 2020-11-10 DIAGNOSIS — J449 Chronic obstructive pulmonary disease, unspecified: Secondary | ICD-10-CM | POA: Insufficient documentation

## 2020-11-10 DIAGNOSIS — R0602 Shortness of breath: Secondary | ICD-10-CM | POA: Diagnosis not present

## 2020-11-10 DIAGNOSIS — I10 Essential (primary) hypertension: Secondary | ICD-10-CM | POA: Diagnosis not present

## 2020-11-10 DIAGNOSIS — I208 Other forms of angina pectoris: Secondary | ICD-10-CM | POA: Diagnosis present

## 2020-11-10 DIAGNOSIS — G4730 Sleep apnea, unspecified: Secondary | ICD-10-CM | POA: Insufficient documentation

## 2020-11-10 LAB — ECHOCARDIOGRAM COMPLETE
AR max vel: 1.98 cm2
AV Area VTI: 2.04 cm2
AV Area mean vel: 2.11 cm2
AV Mean grad: 4 mmHg
AV Peak grad: 7 mmHg
Ao pk vel: 1.33 m/s
Area-P 1/2: 5.09 cm2
S' Lateral: 2.74 cm

## 2020-11-10 NOTE — Progress Notes (Signed)
*  PRELIMINARY RESULTS* Echocardiogram 2D Echocardiogram has been performed.  Caitlin Hardin 11/10/2020, 10:41 AM

## 2020-11-16 ENCOUNTER — Encounter
Admission: RE | Admit: 2020-11-16 | Discharge: 2020-11-16 | Disposition: A | Discharge status: Home / Self Care | Payer: Medicare Other | Source: Ambulatory Visit | Attending: Internal Medicine | Admitting: Internal Medicine

## 2020-11-16 ENCOUNTER — Other Ambulatory Visit: Payer: Self-pay

## 2020-11-16 DIAGNOSIS — R0602 Shortness of breath: Secondary | ICD-10-CM | POA: Insufficient documentation

## 2020-11-16 DIAGNOSIS — I208 Other forms of angina pectoris: Secondary | ICD-10-CM | POA: Insufficient documentation

## 2020-11-16 LAB — NM MYOCAR MULTI W/SPECT W/WALL MOTION / EF
Estimated workload: 1 METS
Exercise duration (min): 1 min
Exercise duration (sec): 33 s
LV dias vol: 98 mL (ref 46–106)
LV sys vol: 56 mL
Peak HR: 86 {beats}/min
Percent HR: 52 %
Rest HR: 59 {beats}/min
SDS: 2
SRS: 4
SSS: 4
TID: 1.07

## 2020-11-16 MED ORDER — TECHNETIUM TC 99M TETROFOSMIN IV KIT
10.0000 | PACK | Freq: Once | INTRAVENOUS | Status: AC | PRN
  Administered 2020-11-16: 10.619 via INTRAVENOUS

## 2020-11-16 MED ORDER — TECHNETIUM TC 99M TETROFOSMIN IV KIT
30.0000 | PACK | Freq: Once | INTRAVENOUS | Status: AC | PRN
  Administered 2020-11-16: 31.289 via INTRAVENOUS

## 2020-11-16 MED ORDER — REGADENOSON 0.4 MG/5ML IV SOLN
0.4000 mg | Freq: Once | INTRAVENOUS | Status: AC
  Administered 2020-11-16: 0.4 mg via INTRAVENOUS

## 2021-03-25 ENCOUNTER — Emergency Department: Payer: Medicare Other

## 2021-03-25 ENCOUNTER — Inpatient Hospital Stay
Admission: EM | Admit: 2021-03-25 | Discharge: 2021-03-28 | DRG: 683 | Disposition: A | Discharge status: Skilled Nursing Facility | Payer: Medicare Other | Source: Skilled Nursing Facility | Attending: Family Medicine | Admitting: Family Medicine

## 2021-03-25 ENCOUNTER — Other Ambulatory Visit: Payer: Self-pay

## 2021-03-25 DIAGNOSIS — F419 Anxiety disorder, unspecified: Secondary | ICD-10-CM | POA: Diagnosis present

## 2021-03-25 DIAGNOSIS — Z23 Encounter for immunization: Secondary | ICD-10-CM

## 2021-03-25 DIAGNOSIS — Z7982 Long term (current) use of aspirin: Secondary | ICD-10-CM

## 2021-03-25 DIAGNOSIS — E559 Vitamin D deficiency, unspecified: Secondary | ICD-10-CM | POA: Diagnosis present

## 2021-03-25 DIAGNOSIS — K219 Gastro-esophageal reflux disease without esophagitis: Secondary | ICD-10-CM | POA: Diagnosis present

## 2021-03-25 DIAGNOSIS — D729 Disorder of white blood cells, unspecified: Secondary | ICD-10-CM

## 2021-03-25 DIAGNOSIS — E860 Dehydration: Secondary | ICD-10-CM | POA: Diagnosis present

## 2021-03-25 DIAGNOSIS — F32A Depression, unspecified: Secondary | ICD-10-CM

## 2021-03-25 DIAGNOSIS — J449 Chronic obstructive pulmonary disease, unspecified: Secondary | ICD-10-CM | POA: Diagnosis present

## 2021-03-25 DIAGNOSIS — E669 Obesity, unspecified: Secondary | ICD-10-CM

## 2021-03-25 DIAGNOSIS — Z7984 Long term (current) use of oral hypoglycemic drugs: Secondary | ICD-10-CM

## 2021-03-25 DIAGNOSIS — G4733 Obstructive sleep apnea (adult) (pediatric): Secondary | ICD-10-CM | POA: Diagnosis present

## 2021-03-25 DIAGNOSIS — E1140 Type 2 diabetes mellitus with diabetic neuropathy, unspecified: Secondary | ICD-10-CM | POA: Diagnosis present

## 2021-03-25 DIAGNOSIS — Z6841 Body Mass Index (BMI) 40.0 and over, adult: Secondary | ICD-10-CM

## 2021-03-25 DIAGNOSIS — Z20822 Contact with and (suspected) exposure to covid-19: Secondary | ICD-10-CM | POA: Diagnosis present

## 2021-03-25 DIAGNOSIS — N390 Urinary tract infection, site not specified: Secondary | ICD-10-CM | POA: Diagnosis present

## 2021-03-25 DIAGNOSIS — I10 Essential (primary) hypertension: Secondary | ICD-10-CM

## 2021-03-25 DIAGNOSIS — N3001 Acute cystitis with hematuria: Secondary | ICD-10-CM

## 2021-03-25 DIAGNOSIS — N179 Acute kidney failure, unspecified: Principal | ICD-10-CM | POA: Diagnosis present

## 2021-03-25 DIAGNOSIS — D72828 Other elevated white blood cell count: Secondary | ICD-10-CM

## 2021-03-25 DIAGNOSIS — B9620 Unspecified Escherichia coli [E. coli] as the cause of diseases classified elsewhere: Secondary | ICD-10-CM | POA: Diagnosis present

## 2021-03-25 DIAGNOSIS — K760 Fatty (change of) liver, not elsewhere classified: Secondary | ICD-10-CM | POA: Diagnosis present

## 2021-03-25 DIAGNOSIS — J9610 Chronic respiratory failure, unspecified whether with hypoxia or hypercapnia: Secondary | ICD-10-CM | POA: Diagnosis present

## 2021-03-25 DIAGNOSIS — M109 Gout, unspecified: Secondary | ICD-10-CM | POA: Diagnosis present

## 2021-03-25 DIAGNOSIS — Z79899 Other long term (current) drug therapy: Secondary | ICD-10-CM

## 2021-03-25 DIAGNOSIS — E119 Type 2 diabetes mellitus without complications: Secondary | ICD-10-CM

## 2021-03-25 DIAGNOSIS — E1159 Type 2 diabetes mellitus with other circulatory complications: Secondary | ICD-10-CM

## 2021-03-25 LAB — CBC
HCT: 32.8 % — ABNORMAL LOW (ref 36.0–46.0)
Hemoglobin: 11.1 g/dL — ABNORMAL LOW (ref 12.0–15.0)
MCH: 32.6 pg (ref 26.0–34.0)
MCHC: 33.8 g/dL (ref 30.0–36.0)
MCV: 96.5 fL (ref 80.0–100.0)
Platelets: 206 10*3/uL (ref 150–400)
RBC: 3.4 MIL/uL — ABNORMAL LOW (ref 3.87–5.11)
RDW: 13.7 % (ref 11.5–15.5)
WBC: 13.3 10*3/uL — ABNORMAL HIGH (ref 4.0–10.5)
nRBC: 0 % (ref 0.0–0.2)

## 2021-03-25 LAB — BASIC METABOLIC PANEL
Anion gap: 9 (ref 5–15)
BUN: 55 mg/dL — ABNORMAL HIGH (ref 6–20)
CO2: 26 mmol/L (ref 22–32)
Calcium: 8.8 mg/dL — ABNORMAL LOW (ref 8.9–10.3)
Chloride: 98 mmol/L (ref 98–111)
Creatinine, Ser: 4.54 mg/dL — ABNORMAL HIGH (ref 0.44–1.00)
GFR, Estimated: 11 mL/min — ABNORMAL LOW (ref >60)
Glucose, Bld: 162 mg/dL — ABNORMAL HIGH (ref 70–99)
Potassium: 4.8 mmol/L (ref 3.5–5.1)
Sodium: 133 mmol/L — ABNORMAL LOW (ref 135–145)

## 2021-03-25 LAB — URINALYSIS, COMPLETE (UACMP) WITH MICROSCOPIC
Bilirubin Urine: NEGATIVE
Glucose, UA: NEGATIVE mg/dL
Ketones, ur: NEGATIVE mg/dL
Nitrite: POSITIVE — AB
Protein, ur: 30 mg/dL — AB
Specific Gravity, Urine: 1.015 (ref 1.005–1.030)
WBC, UA: >50 WBC/hpf (ref 0–5)
pH: 5.5 (ref 5.0–8.0)

## 2021-03-25 LAB — RESP PANEL BY RT-PCR (FLU A&B, COVID) ARPGX2
Influenza A by PCR: NEGATIVE
Influenza B by PCR: NEGATIVE
SARS Coronavirus 2 by RT PCR: NEGATIVE

## 2021-03-25 MED ORDER — ONDANSETRON HCL 4 MG/2ML IJ SOLN
4.0000 mg | Freq: Four times a day (QID) | INTRAMUSCULAR | Status: DC | PRN
Start: 2021-03-25 — End: 2021-03-28

## 2021-03-25 MED ORDER — ACETAMINOPHEN 650 MG RE SUPP: 650.0000 mg | Freq: Four times a day (QID) | RECTAL | Status: DC | PRN

## 2021-03-25 MED ORDER — INSULIN ASPART 100 UNIT/ML IJ SOLN
0.0000 [IU] | Freq: Three times a day (TID) | INTRAMUSCULAR | Status: DC
  Administered 2021-03-26: 1 [IU] via SUBCUTANEOUS
  Filled 2021-03-25: qty 1

## 2021-03-25 MED ORDER — HEPARIN SODIUM (PORCINE) 5000 UNIT/ML IJ SOLN
5000.0000 [IU] | Freq: Three times a day (TID) | INTRAMUSCULAR | Status: DC
  Administered 2021-03-26 – 2021-03-28 (×7): 5000 [IU] via SUBCUTANEOUS
  Filled 2021-03-25 (×7): qty 1

## 2021-03-25 MED ORDER — SODIUM CHLORIDE 0.9 % IV SOLN
2.0000 g | Freq: Once | INTRAVENOUS | Status: AC
  Administered 2021-03-25: 2 g via INTRAVENOUS
  Filled 2021-03-25: qty 20

## 2021-03-25 MED ORDER — FLUTICASONE PROPIONATE 50 MCG/ACT NA SUSP
2.0000 | Freq: Every day | NASAL | Status: DC
  Administered 2021-03-26 – 2021-03-28 (×3): 2 via NASAL
  Filled 2021-03-25: qty 16

## 2021-03-25 MED ORDER — ONDANSETRON HCL 4 MG PO TABS
4.0000 mg | ORAL_TABLET | Freq: Four times a day (QID) | ORAL | Status: DC | PRN
Start: 2021-03-25 — End: 2021-03-28

## 2021-03-25 MED ORDER — TRAMADOL HCL 50 MG PO TABS: 50.0000 mg | ORAL_TABLET | Freq: Four times a day (QID) | ORAL | Status: DC | PRN

## 2021-03-25 MED ORDER — BUPROPION HCL 100 MG PO TABS
100.0000 mg | ORAL_TABLET | Freq: Two times a day (BID) | ORAL | Status: DC
  Administered 2021-03-26 – 2021-03-28 (×5): 100 mg via ORAL
  Filled 2021-03-25 (×7): qty 1

## 2021-03-25 MED ORDER — SODIUM CHLORIDE 0.9 % IV BOLUS
1000.0000 mL | Freq: Once | INTRAVENOUS | Status: AC
  Administered 2021-03-25: 1000 mL via INTRAVENOUS

## 2021-03-25 MED ORDER — IPRATROPIUM-ALBUTEROL 0.5-2.5 (3) MG/3ML IN SOLN
3.0000 mL | Freq: Every day | RESPIRATORY_TRACT | Status: DC
  Administered 2021-03-26 – 2021-03-28 (×3): 3 mL via RESPIRATORY_TRACT
  Filled 2021-03-25 (×3): qty 3

## 2021-03-25 MED ORDER — ACETAMINOPHEN 325 MG PO TABS
650.0000 mg | ORAL_TABLET | Freq: Four times a day (QID) | ORAL | Status: DC | PRN
  Administered 2021-03-26 – 2021-03-27 (×3): 650 mg via ORAL
  Filled 2021-03-25 (×3): qty 2

## 2021-03-25 MED ORDER — INSULIN ASPART 100 UNIT/ML IJ SOLN: 0.0000 [IU] | Freq: Every day | INTRAMUSCULAR | Status: DC

## 2021-03-25 MED ORDER — SODIUM CHLORIDE 0.9 % IV SOLN: Freq: Once | INTRAVENOUS | Status: AC

## 2021-03-25 NOTE — ED Triage Notes (Signed)
Pt presents to ED from [facility] assisted [facility] for abnormal labs. Pt unsure of lab value but does state "It's my kidneys". Pt on 2L/min via [place] chronically. When pt is asked if she has any c/o states "just a little chest pain". Pt denies any other complaints.

## 2021-03-25 NOTE — ED Notes (Signed)
Pt denies any chest pain. NAD.

## 2021-03-25 NOTE — H&P (Addendum)
History and Physical   Caitlin Hardin KVQ:[id number] DOB: 11-22-1965 DOA: 03/25/2021  PCP: Caitlin Nasuti, MD  Outpatient Specialists: Dr. Edd Arbour Cardiology Patient coming from: [facility]  I have personally briefly reviewed patient's old medical records in [place].  Chief Concern: Abnormal labs/elevated serum creatinine of 4.67  HPI: Caitlin Hardin is a 55 y.o. female with medical history significant for obesity, depression, anxiety, hypertension, obstructive sleep apnea, presents emergency department for chief concerns of abnormal labs from [facility] facility.  At bedside, she was able to tell me her name, age, location, and current calendar year. She reports poor choices in diet.   She reports that the facility was taking any annual labs of everyone when they noted to the patient that she had an abnormal kidney function.  She denies recent changes to medications in her diet.  She reports that dysuria and dark urine in the last few days.  She reports her blood glucose was 235 on Wednesday night. That scared her and she has been feeling stressed since.   Social history: She lives at [facility]. She denies tobacco, etoh, recreational drug use. She is disabled and formerly worked in [employer].   Vaccination history: She is vaccinated for covid 19, three doses and she does not know the brand.   ROS: Constitutional: no weight change, no fever ENT/Mouth: no sore throat, no rhinorrhea Eyes: no eye pain, no vision changes Cardiovascular: no chest pain, no dyspnea,  no edema, no palpitations Respiratory: no cough, no sputum, no wheezing Gastrointestinal: no nausea, no vomiting, no diarrhea, no constipation Genitourinary: no urinary incontinence, + dysuria, no hematuria Musculoskeletal: no arthralgias, no myalgias Skin: no skin lesions, no pruritus, Neuro: + weakness, no loss of consciousness, no syncope Psych: no anxiety,  no depression, + decrease appetite Heme/Lymph: no bruising, no bleeding  ED Course: Discussed with emergency medicine provider, patient requiring hospitalization for urinary tract infection and acute kidney injury.  Vitals in the emergency department was remarkable for temperature of 98.9, respiration rate of 19, heart rate 76, blood pressure 106/66 and improved to 115/70, SPO2 of 98% on 2 L nasal cannula.  Labs in the emergency department was remarkable for sodium 133, potassium 4.8, chloride 98, bicarb 26, BUN of 55, serum creatinine of 4.54, nonfasting blood glucose 192, EGFR 11, WBC 13.3, hemoglobin 11.1, platelets 206.  UA was positive for large leukocytes and nitrates.  COVID/influenza A/influenza B PCR were negative.  Assessment/Plan  Principal Problem:   AKI (acute kidney injury) ([place]) Active Problems:   Acute lower UTI   OSA (obstructive sleep apnea)   Essential hypertension   Depression   Diabetes mellitus type 2, noninsulin dependent (HCC)   Obesity, diabetes, and hypertension syndrome (HCC)   Neutrophilic leukocytosis   # Acute kidney injury-query secondary to UTI - No CKD on baseline, most recent serum creatinine was 0.88 in February 2020 - Status post ceftriaxone 2 g IV, sodium chloride 1 L bolus, sodium chloride 125 mL/h per EDP -Holding home naproxen and enalapril -BMP in the a.m. -a.m. team to consider nephrology consult if her serum creatinine does not improve  # Hypertension-resumed home clonidine 0.2 mg 3 times daily -Holding home enalapril 10 mg daily due to acute kidney injury  # Neuropathy-gabapentin 400 mg in the a.m., 300 mg twice daily  # Non-insulin-dependent diabetes mellitus-holding home metformin 1500 mg twice daily -Insulin SSI with at bedtime coverage ordered  # GERD-PPI  # Urinary incontinence-oxybutynin 5  mg twice daily  # Depression/anxiety-resumed home bupropion 100 mg twice daily, sertraline 50 mg daily  # Generalized pain-tramadol  50 mg every 6 hours as needed for moderate pain, 3 doses ordered  # Gout-in remission, holding home allopurinol due to acute kidney injury at this time  # OSA-CPAP nightly ordered  Chart reviewed.   DVT prophylaxis: Heparin 5000 units subcutaneous every 8 hours Code Status: Full code Diet: Heart healthy Family Communication: No Disposition Plan: Pending clinical course Consults called: No Admission status: Observation, telemetry, MedSurg  Past Medical History:  Diagnosis Date   Asthma    COPD (chronic obstructive pulmonary disease) ([place])    Gout    Hypertension    Sleep apnea    No past surgical history on file.  Social History:  reports that she has never smoked. She has never used smokeless tobacco. She reports that she does not drink alcohol and does not use drugs.  No Known Allergies Family History  Problem Relation Age of Onset   Breast cancer Paternal Grandmother    Family history: Family history reviewed and not pertinent  Prior to Admission medications   Medication Sig Start Date End Date Taking? Authorizing Provider  acetaminophen (PHARBETOL) 325 MG tablet Take 2 tablets by mouth every 6 (six) hours as needed. 05/31/17   [provider]  allopurinol (ZYLOPRIM) 100 MG tablet Take 100 mg by mouth 2 (two) times daily. 08/15/18   [provider]  aspirin EC 81 MG tablet Take 81 mg by mouth daily. 05/24/17   [provider]  azelastine (ASTELIN) 0.1 % nasal spray Place 1 spray into both nostrils daily. 08/15/18   [provider]  azelastine (OPTIVAR) 0.05 % ophthalmic solution Place 1 drop into both eyes daily. 08/28/18   [provider]  buPROPion (WELLBUTRIN) 100 MG tablet Take 100 mg by mouth 2 (two) times daily. 08/15/18   [provider]  cetirizine (ZYRTEC) 10 MG tablet Take 10 mg by mouth daily. 08/18/16   [provider]  cholecalciferol (VITAMIN D) 25 MCG (1000 UT) tablet Take 1 tablet by mouth daily.  05/24/17   [provider]  cloNIDine (CATAPRES) 0.2 MG tablet Take 1 tablet by mouth 3 (three) times daily. 08/15/18   [provider]  COMBIVENT RESPIMAT 20-100 MCG/ACT AERS respimat Inhale 1 puff into the lungs daily. 07/26/18   [provider]  enalapril (VASOTEC) 5 MG tablet Take 1 tablet by mouth daily. 08/18/18   [provider]  fluticasone (FLONASE) 50 MCG/ACT nasal spray Place 2 sprays into both nostrils daily. 08/02/18   [provider]  gabapentin (NEURONTIN) 300 MG capsule Take 300 mg by mouth 3 (three) times daily. 08/15/18   [provider]  magnesium oxide (MAG-OX) 400 MG tablet Take 400 mg by mouth daily. 05/24/17   [provider]  metFORMIN (GLUCOPHAGE) 500 MG tablet Take 1 tablet by mouth 2 (two) times daily. 08/15/18   [provider]  naproxen sodium (ALEVE) 220 MG tablet Take 220 mg by mouth every 6 (six) hours as needed.    [provider]  ranitidine (ZANTAC) 150 MG tablet Take 150 mg by mouth 2 (two) times daily. 08/15/18   [provider]  traMADol (ULTRAM) 50 MG tablet Take 50 mg by mouth every 6 (six) hours as needed. 07/03/18   [provider]   Physical Exam: Vitals:   03/25/21 2130 03/25/21 2200 03/25/21 2303 03/25/21 2330  BP: 109/73 115/70 113/82 104/66  Pulse:  82 78  Resp:   19 20  Temp:   97.9 F (36.6 C)   TempSrc:   Oral   SpO2:   96% 97%  Weight:      Height:       Constitutional: appears younger than chronological age, NAD, calm, comfortable Eyes: PERRL, lids and conjunctivae normal ENMT: Mucous membranes are moist. Posterior pharynx clear of any exudate or lesions. Age-appropriate dentition. Hearing appropriate Neck: normal, supple, no masses, no thyromegaly Respiratory: clear to auscultation bilaterally, no wheezing, no crackles. Normal respiratory effort. No accessory muscle use.  Cardiovascular: Regular rate and rhythm, no murmurs / rubs / gallops. No  extremity edema. 2+ pedal pulses. No carotid bruits.  Abdomen: Morbidly obese abdomen, no tenderness, no masses palpated, no hepatosplenomegaly. Bowel sounds positive.  Musculoskeletal: no clubbing / cyanosis. No joint deformity upper and lower extremities. Good ROM, no contractures, no atrophy. Normal muscle tone.  Skin: no rashes, lesions, ulcers. No induration.  Bilateral fingertip with peeling skin.  Patient states she is a picker Neurologic: Sensation intact. Strength 5/5 in all 4.  Psychiatric: Normal judgment and insight. Alert and oriented x 3. Normal mood.   EKG: independently reviewed, showing sinus rhythm with rate of 77, QTc 414  Chest x-ray on Admission: I personally reviewed and I agree with radiologist reading as below.  CT Renal Stone Study  Result Date: 03/25/2021 CLINICAL DATA:  Urinary tract stone, symptomatic/complicated. Elevated serum creatinine. EXAM: CT ABDOMEN AND PELVIS WITHOUT CONTRAST TECHNIQUE: Multidetector CT imaging of the abdomen and pelvis was performed following the standard protocol without IV contrast. COMPARISON:  None. FINDINGS: Lower chest: The visualized lung bases are clear bilaterally. The visualized heart and pericardium are unremarkable. Hepatobiliary: Mild to moderate hepatic steatosis. No definite intrahepatic mass on this noncontrast examination. No intra or extrahepatic biliary ductal dilation. Gallbladder unremarkable. Pancreas: Unremarkable Spleen: Unremarkable Adrenals/Urinary Tract: Adrenal glands are unremarkable. Kidneys are normal, without renal calculi, focal lesion, or hydronephrosis. Bladder is unremarkable. Stomach/Bowel: Stomach is within normal limits. Appendix appears normal. No evidence of bowel wall thickening, distention, or inflammatory changes. No free intraperitoneal gas or fluid. Vascular/Lymphatic: Mild aortoiliac atherosclerotic calcification. No aortic aneurysm. No pathologic adenopathy within the abdomen and pelvis. Reproductive:  Uterus and bilateral adnexa are unremarkable. Other: No abdominal wall hernia.  The rectum is unremarkable. Musculoskeletal: No acute bone abnormality. Degenerative changes are seen within the lumbar spine. No lytic or blastic bone lesion. IMPRESSION: No acute intra-abdominal pathology identified. No definite radiographic explanation for the patient's reported symptoms. Mild to moderate hepatic steatosis. Aortic Atherosclerosis (ICD10-I70.0). Electronically Signed   By: Fidela Salisbury M.D.   On: 03/25/2021 21:56    Labs on Admission: I have personally reviewed following labs  CBC: Recent Labs  Lab 03/25/21 1753  WBC 13.3*  HGB 11.1*  HCT 32.8*  MCV 96.5  PLT 009   Basic Metabolic Panel: Recent Labs  Lab 03/25/21 1753  NA 133*  K 4.8  CL 98  CO2 26  GLUCOSE 162*  BUN 55*  CREATININE 4.54*  CALCIUM 8.8*   GFR: Estimated Creatinine Clearance: 17 mL/min (A) (by C-G formula based on SCr of 4.54 mg/dL (H)).  Urine analysis:    Component Value Date/Time   COLORURINE YELLOW (A) 03/25/2021 1756   APPEARANCEUR CLOUDY (A) 03/25/2021 1756   LABSPEC 1.015 03/25/2021 1756   PHURINE 5.5 03/25/2021 1756   GLUCOSEU NEGATIVE 03/25/2021 1756   HGBUR SMALL (A) 03/25/2021 1756   BILIRUBINUR NEGATIVE 03/25/2021 1756   KETONESUR  NEGATIVE 03/25/2021 1756   PROTEINUR 30 (A) 03/25/2021 1756   NITRITE POSITIVE (A) 03/25/2021 1756   LEUKOCYTESUR LARGE (A) 03/25/2021 1756   Dr. Tobie Poet [facility]  If 7PM-7AM, please contact overnight-coverage provider If 7AM-7PM, please contact day coverage provider [email or web address]  03/25/2021, 11:48 PM

## 2021-03-25 NOTE — ED Provider Notes (Signed)
[facility] Emergency Department Provider Note  ____________________________________________   Event Date/Time   First MD Initiated Contact with Patient 03/25/21 2047     (approximate)  I have reviewed the triage vital signs and the nursing notes.   HISTORY  Chief Complaint Abnormal Lab and Chest Pain    HPI Caitlin Hardin is a 55 y.o. female with history of hypertension, COPD, here with generalized weakness.  The patient states that for the last week or so, she has had increasing urinary frequency and mild dysuria.  She said some suprapubic discomfort.  She is also noticed darker urine.  She called her doctor who sent screening lab work from her assisted living facility and she was told to come to the ER today for admission.  She states she has been eating and drinking normally.  She has not noticed any decrease in her urine output.  Reports the burning has persisted.  Denies any flank pain though she has had some mild nausea.  No vomiting.  No constipation or diarrhea.  No other complaints.  No recent medication changes.  She takes ibuprofen intermittently but not daily or recently increased.      Past Medical History:  Diagnosis Date   Asthma    COPD (chronic obstructive pulmonary disease) (HCC)    Gout    Hypertension    Sleep apnea     Patient Active Problem List   Diagnosis Date Noted   AKI (acute kidney injury) (HCC) 03/25/2021   Acute lower UTI 03/25/2021   OSA (obstructive sleep apnea) 03/25/2021   Essential hypertension 03/25/2021   Depression 03/25/2021   Diabetes mellitus type 2, noninsulin dependent (HCC) 03/25/2021    No past surgical history on file.  Prior to Admission medications   Medication Sig Start Date End Date Taking? Authorizing Provider  allopurinol (ZYLOPRIM) 100 MG tablet Take 100 mg by mouth 2 (two) times daily. 08/15/18  Yes [provider]  aspirin 81 MG chewable tablet Chew 81 mg by mouth daily. 12/07/20   Yes [provider]  buPROPion (WELLBUTRIN) 100 MG tablet Take 100 mg by mouth 2 (two) times daily. 08/15/18  Yes [provider]  calcium carbonate (OS-CAL - DOSED IN MG OF ELEMENTAL CALCIUM) 1250 (500 Ca) MG tablet Take 1 tablet by mouth 2 (two) times daily. 10/07/20  Yes [provider]  cholecalciferol (VITAMIN D) 25 MCG (1000 UT) tablet Take 1 tablet by mouth daily. 05/24/17  Yes [provider]  cloNIDine (CATAPRES) 0.2 MG tablet Take 1 tablet by mouth 3 (three) times daily. 08/15/18  Yes [provider]  COMBIVENT RESPIMAT 20-100 MCG/ACT AERS respimat Inhale 1 puff into the lungs 4 (four) times daily. 07/26/18  Yes [provider]  diclofenac Sodium (VOLTAREN) 1 % GEL Apply 4 g topically 2 (two) times daily. To both knees and hips. 03/23/21  Yes [provider]  enalapril (VASOTEC) 10 MG tablet Take 10 mg by mouth daily. 03/09/21  Yes [provider]  FEROSUL 325 (65 Fe) MG tablet Take 325 mg by mouth 2 (two) times daily. 12/07/20  Yes [provider]  fluticasone (FLONASE) 50 MCG/ACT nasal spray Place 2 sprays into both nostrils daily. 08/02/18  Yes [provider]  gabapentin (NEURONTIN) 300 MG capsule Take 300 mg by mouth 2 (two) times daily. Take at 1400 and 2000 08/15/18  Yes [provider]  gabapentin (NEURONTIN) 400 MG capsule Take 400 mg by mouth in the morning. 03/09/21  Yes [provider]  latanoprost (XALATAN) 0.005 % ophthalmic solution Place 1 drop into both eyes at bedtime. 03/03/21  Yes [provider]  loratadine (CLARITIN) 10 MG tablet Take 10 mg by mouth daily. 10/07/20  Yes [provider]  magnesium oxide (MAG-OX) 400 MG tablet Take 400 mg by mouth daily. 05/24/17  Yes [provider]  metFORMIN (GLUCOPHAGE) 500 MG tablet Take 1.5 tablets by mouth 2 (two) times daily. 08/15/18  Yes [provider]  omeprazole (PRILOSEC) 20 MG capsule Take 20 mg  by mouth daily. Take 30 minutes before breakfast 03/09/21  Yes [provider]  oxybutynin (DITROPAN) 5 MG tablet Take 1 tablet by mouth 2 (two) times daily.   Yes [provider]  sertraline (ZOLOFT) 50 MG tablet Take 25 mg by mouth daily. 10/07/20  Yes [provider]  Zinc Oxide 13 % CREA Apply 1 application topically 2 (two) times daily.   Yes [provider]  acetaminophen (TYLENOL) 325 MG tablet Take 2 tablets by mouth every 6 (six) hours as needed. 05/31/17   [provider]  azelastine (ASTELIN) 0.1 % nasal spray Place 1 spray into both nostrils daily. 08/15/18   [provider]  azelastine (OPTIVAR) 0.05 % ophthalmic solution Place 1 drop into both eyes daily. 08/28/18   [provider]  cetirizine (ZYRTEC) 10 MG tablet Take 10 mg by mouth daily. 08/18/16   [provider]  enalapril (VASOTEC) 5 MG tablet Take 1 tablet by mouth daily. Patient not taking: Reported on 03/25/2021 08/18/18   [provider]  naproxen sodium (ALEVE) 220 MG tablet Take 220 mg by mouth every 6 (six) hours as needed.    [provider]  ranitidine (ZANTAC) 150 MG tablet Take 150 mg by mouth 2 (two) times daily. Patient not taking: No sig reported 08/15/18   [provider]  traMADol (ULTRAM) 50 MG tablet Take 50 mg by mouth every 6 (six) hours as needed. 07/03/18   [provider]    Allergies Patient has no known allergies.  Family History  Problem Relation Age of Onset   Breast cancer Paternal Grandmother     Social History Social History   Tobacco Use   Smoking status: Never   Smokeless tobacco: Never  Vaping Use   Vaping Use: Never used  Substance Use Topics   Alcohol use: No   Drug use: No    Review of Systems  Review of Systems  Constitutional:  Positive for fatigue. Negative for fever.  HENT:  Negative for congestion and sore throat.   Eyes:  Negative for visual disturbance.  Respiratory:   Negative for cough and shortness of breath.   Cardiovascular:  Negative for chest pain.  Gastrointestinal:  Positive for nausea. Negative for abdominal pain, diarrhea and vomiting.  Genitourinary:  Positive for dysuria and frequency. Negative for flank pain.  Musculoskeletal:  Negative for back pain and neck pain.  Skin:  Negative for rash and wound.  Neurological:  Negative for weakness.  All other systems reviewed and are negative.   ____________________________________________  PHYSICAL EXAM:      VITAL SIGNS: ED Triage Vitals  Enc Vitals Group     BP 03/25/21 1739 106/66     Pulse Rate 03/25/21 1739 76     Resp 03/25/21 1739 19     Temp 03/25/21 1739 98.9 F (37.2 C)     Temp Source 03/25/21 1739 Oral     SpO2 03/25/21 1739 98 %  Weight 03/25/21 1740 286 lb (129.7 kg)     Height 03/25/21 1740 4\' 11"  (1.499 m)     Head Circumference --      Peak Flow --      Pain Score 03/25/21 1740 4     Pain Loc --      Pain Edu? --      Excl. in GC? --      Physical Exam Vitals and nursing note reviewed.  Constitutional:      General: She is not in acute distress.    Appearance: She is well-developed.  HENT:     Head: Normocephalic and atraumatic.  Eyes:     Conjunctiva/sclera: Conjunctivae normal.  Cardiovascular:     Rate and Rhythm: Normal rate and regular rhythm.     Heart sounds: Normal heart sounds.  Pulmonary:     Effort: Pulmonary effort is normal. No respiratory distress.     Breath sounds: No wheezing.  Abdominal:     General: There is no distension.     Tenderness: There is abdominal tenderness in the suprapubic area. There is no guarding or rebound.  Musculoskeletal:     Cervical back: Neck supple.  Skin:    General: Skin is warm.     Capillary Refill: Capillary refill takes less than 2 seconds.     Findings: No rash.  Neurological:     Mental Status: She is alert and oriented to person, place, and time.     Motor: No abnormal muscle tone.       ____________________________________________   LABS (all labs ordered are listed, but only abnormal results are displayed)  Labs Reviewed  URINALYSIS, COMPLETE (UACMP) WITH MICROSCOPIC - Abnormal; Notable for the following components:      Result Value   Color, Urine YELLOW (*)    APPearance CLOUDY (*)    Hgb urine dipstick SMALL (*)    Protein, ur 30 (*)    Nitrite POSITIVE (*)    Leukocytes,Ua LARGE (*)    Bacteria, UA FEW (*)    All other components within normal limits  BASIC METABOLIC PANEL - Abnormal; Notable for the following components:   Sodium 133 (*)    Glucose, Bld 162 (*)    BUN 55 (*)    Creatinine, Ser 4.54 (*)    Calcium 8.8 (*)    GFR, Estimated 11 (*)    All other components within normal limits  CBC - Abnormal; Notable for the following components:   WBC 13.3 (*)    RBC 3.40 (*)    Hemoglobin 11.1 (*)    HCT 32.8 (*)    All other components within normal limits  RESP PANEL BY RT-PCR (FLU A&B, COVID) ARPGX2  URINE CULTURE  HIV ANTIBODY (ROUTINE TESTING W REFLEX)  BASIC METABOLIC PANEL  CBC  HEMOGLOBIN A1C  POC URINE PREG, ED    ____________________________________________  EKG: Normal sinus rhythm, ventricular rate 77.  PR 146, QRS 78, QTc 414.  No acute ST elevations or depressions.  No EKG evidence of acute ischemia or infarct. ________________________________________  RADIOLOGY All imaging, including plain films, CT scans, and ultrasounds, independently reviewed by me, and interpretations confirmed via formal radiology reads.  ED MD interpretation:   CT stone: No acute intra-abdominal pathology  Official radiology report(s): CT Renal Stone Study  Result Date: 03/25/2021 CLINICAL DATA:  Urinary tract stone, symptomatic/complicated. Elevated serum creatinine. EXAM: CT ABDOMEN AND PELVIS WITHOUT CONTRAST TECHNIQUE: Multidetector CT imaging of the abdomen and pelvis was  performed following the standard protocol without IV contrast.  COMPARISON:  None. FINDINGS: Lower chest: The visualized lung bases are clear bilaterally. The visualized heart and pericardium are unremarkable. Hepatobiliary: Mild to moderate hepatic steatosis. No definite intrahepatic mass on this noncontrast examination. No intra or extrahepatic biliary ductal dilation. Gallbladder unremarkable. Pancreas: Unremarkable Spleen: Unremarkable Adrenals/Urinary Tract: Adrenal glands are unremarkable. Kidneys are normal, without renal calculi, focal lesion, or hydronephrosis. Bladder is unremarkable. Stomach/Bowel: Stomach is within normal limits. Appendix appears normal. No evidence of bowel wall thickening, distention, or inflammatory changes. No free intraperitoneal gas or fluid. Vascular/Lymphatic: Mild aortoiliac atherosclerotic calcification. No aortic aneurysm. No pathologic adenopathy within the abdomen and pelvis. Reproductive: Uterus and bilateral adnexa are unremarkable. Other: No abdominal wall hernia.  The rectum is unremarkable. Musculoskeletal: No acute bone abnormality. Degenerative changes are seen within the lumbar spine. No lytic or blastic bone lesion. IMPRESSION: No acute intra-abdominal pathology identified. No definite radiographic explanation for the patient's reported symptoms. Mild to moderate hepatic steatosis. Aortic Atherosclerosis (ICD10-I70.0). Electronically Signed   By: Helyn Numbers M.D.   On: 03/25/2021 21:56    ____________________________________________  PROCEDURES   Procedure(s) performed (including Critical Care):  Procedures  ____________________________________________  INITIAL IMPRESSION / MDM / ASSESSMENT AND PLAN / ED COURSE  As part of my medical decision making, I reviewed the following data within the electronic MEDICAL RECORD NUMBER Nursing notes reviewed and incorporated, Old chart reviewed, Notes from prior ED visits, and [place] Controlled Substance Database       *Caitlin Hardin was evaluated in Emergency Department on  03/25/2021 for the symptoms described in the history of present illness. She was evaluated in the context of the global COVID-19 pandemic, which necessitated consideration that the patient might be at risk for infection with the SARS-CoV-2 virus that causes COVID-19. Institutional protocols and algorithms that pertain to the evaluation of patients at risk for COVID-19 are in a state of rapid change based on information released by regulatory bodies including the CDC and federal and state organizations. These policies and algorithms were followed during the patient's care in the ED.  Some ED evaluations and interventions may be delayed as a result of limited staffing during the pandemic.*     Medical Decision Making: 55 year old female who with generalized weakness and abnormal lab work.  Patient is overall nontoxic without signs of sepsis.  Lab work reviewed shows leukocytosis of 13.3 with UA consistent with UTI.  Patient also has marked AKI with BUN and creatinine of 55 and 4.54.  No known history of CKD.  COVID-negative.  Given her UTI with significant AKI despite no reported nausea or vomiting, CT scan obtained to evaluate for possible obstruction or other complication.  CT reviewed and shows no acute abnormality.  Will plan to admit for hydration and treatment of UTI.  Rocephin given.  ____________________________________________  FINAL CLINICAL IMPRESSION(S) / ED DIAGNOSES  Final diagnoses:  AKI (acute kidney injury) (HCC)  Acute cystitis with hematuria     MEDICATIONS GIVEN DURING THIS VISIT:  Medications  0.9 %  sodium chloride infusion (has no administration in time range)  traMADol (ULTRAM) tablet 50 mg (has no administration in time range)  buPROPion (WELLBUTRIN) tablet 100 mg (has no administration in time range)  Ipratropium-Albuterol (COMBIVENT) respimat 1 puff (has no administration in time range)  fluticasone (FLONASE) 50 MCG/ACT nasal spray 2 spray (has no administration in time  range)  acetaminophen (TYLENOL) tablet 650 mg (has no administration in time range)  Or  acetaminophen (TYLENOL) suppository 650 mg (has no administration in time range)  ondansetron (ZOFRAN) tablet 4 mg (has no administration in time range)    Or  ondansetron (ZOFRAN) injection 4 mg (has no administration in time range)  heparin injection 5,000 Units (has no administration in time range)  insulin aspart (novoLOG) injection 0-9 Units (has no administration in time range)  insulin aspart (novoLOG) injection 0-5 Units (has no administration in time range)  cefTRIAXone (ROCEPHIN) 2 g in sodium chloride 0.9 % 100 mL IVPB (0 g Intravenous Stopped 03/25/21 2208)  sodium chloride 0.9 % bolus 1,000 mL (0 mLs Intravenous Stopped 03/25/21 2303)     ED Discharge Orders     None        Note:  This document was prepared using Dragon voice recognition software and may include unintentional dictation errors.   Shaune Pollack, MD 03/25/21 [phone]

## 2021-03-25 NOTE — ED Triage Notes (Signed)
Pt brought in ED via [facility] from [facility]. For abnormal labs. Her Creatinine per facility was 4.67 they sent her here to be evaluated.

## 2021-03-25 NOTE — ED Notes (Signed)
Transport paged to take pt up to her admission bed

## 2021-03-26 ENCOUNTER — Encounter: Payer: Self-pay | Admitting: Internal Medicine

## 2021-03-26 DIAGNOSIS — N390 Urinary tract infection, site not specified: Secondary | ICD-10-CM | POA: Diagnosis present

## 2021-03-26 DIAGNOSIS — J449 Chronic obstructive pulmonary disease, unspecified: Secondary | ICD-10-CM | POA: Diagnosis present

## 2021-03-26 DIAGNOSIS — K219 Gastro-esophageal reflux disease without esophagitis: Secondary | ICD-10-CM | POA: Diagnosis present

## 2021-03-26 DIAGNOSIS — E559 Vitamin D deficiency, unspecified: Secondary | ICD-10-CM | POA: Diagnosis present

## 2021-03-26 DIAGNOSIS — E1140 Type 2 diabetes mellitus with diabetic neuropathy, unspecified: Secondary | ICD-10-CM | POA: Diagnosis present

## 2021-03-26 DIAGNOSIS — N3001 Acute cystitis with hematuria: Secondary | ICD-10-CM | POA: Diagnosis present

## 2021-03-26 DIAGNOSIS — B9620 Unspecified Escherichia coli [E. coli] as the cause of diseases classified elsewhere: Secondary | ICD-10-CM | POA: Diagnosis present

## 2021-03-26 DIAGNOSIS — M109 Gout, unspecified: Secondary | ICD-10-CM | POA: Diagnosis present

## 2021-03-26 DIAGNOSIS — Z7984 Long term (current) use of oral hypoglycemic drugs: Secondary | ICD-10-CM | POA: Diagnosis not present

## 2021-03-26 DIAGNOSIS — Z79899 Other long term (current) drug therapy: Secondary | ICD-10-CM | POA: Diagnosis not present

## 2021-03-26 DIAGNOSIS — J9610 Chronic respiratory failure, unspecified whether with hypoxia or hypercapnia: Secondary | ICD-10-CM | POA: Diagnosis present

## 2021-03-26 DIAGNOSIS — F419 Anxiety disorder, unspecified: Secondary | ICD-10-CM | POA: Diagnosis present

## 2021-03-26 DIAGNOSIS — I10 Essential (primary) hypertension: Secondary | ICD-10-CM | POA: Diagnosis present

## 2021-03-26 DIAGNOSIS — K760 Fatty (change of) liver, not elsewhere classified: Secondary | ICD-10-CM | POA: Diagnosis present

## 2021-03-26 DIAGNOSIS — Z7982 Long term (current) use of aspirin: Secondary | ICD-10-CM | POA: Diagnosis not present

## 2021-03-26 DIAGNOSIS — F32A Depression, unspecified: Secondary | ICD-10-CM | POA: Diagnosis present

## 2021-03-26 DIAGNOSIS — Z20822 Contact with and (suspected) exposure to covid-19: Secondary | ICD-10-CM | POA: Diagnosis present

## 2021-03-26 DIAGNOSIS — G4733 Obstructive sleep apnea (adult) (pediatric): Secondary | ICD-10-CM | POA: Diagnosis present

## 2021-03-26 DIAGNOSIS — Z23 Encounter for immunization: Secondary | ICD-10-CM | POA: Diagnosis present

## 2021-03-26 DIAGNOSIS — Z6841 Body Mass Index (BMI) 40.0 and over, adult: Secondary | ICD-10-CM | POA: Diagnosis not present

## 2021-03-26 DIAGNOSIS — E860 Dehydration: Secondary | ICD-10-CM | POA: Diagnosis present

## 2021-03-26 DIAGNOSIS — N179 Acute kidney failure, unspecified: Secondary | ICD-10-CM | POA: Diagnosis present

## 2021-03-26 LAB — GLUCOSE, CAPILLARY
Glucose-Capillary: 114 mg/dL — ABNORMAL HIGH (ref 70–99)
Glucose-Capillary: 116 mg/dL — ABNORMAL HIGH (ref 70–99)
Glucose-Capillary: 95 mg/dL (ref 70–99)
Glucose-Capillary: 128 mg/dL — ABNORMAL HIGH (ref 70–99)
Glucose-Capillary: 112 mg/dL — ABNORMAL HIGH (ref 70–99)

## 2021-03-26 LAB — CBC
HCT: 30.9 % — ABNORMAL LOW (ref 36.0–46.0)
Hemoglobin: 10.4 g/dL — ABNORMAL LOW (ref 12.0–15.0)
MCH: 32.2 pg (ref 26.0–34.0)
MCHC: 33.7 g/dL (ref 30.0–36.0)
MCV: 95.7 fL (ref 80.0–100.0)
Platelets: 205 10*3/uL (ref 150–400)
RBC: 3.23 MIL/uL — ABNORMAL LOW (ref 3.87–5.11)
RDW: 13.3 % (ref 11.5–15.5)
WBC: 11.7 10*3/uL — ABNORMAL HIGH (ref 4.0–10.5)
nRBC: 0 % (ref 0.0–0.2)

## 2021-03-26 LAB — IRON AND TIBC
Iron: 33 ug/dL (ref 28–170)
Saturation Ratios: 16 % (ref 10.4–31.8)
TIBC: 209 ug/dL — ABNORMAL LOW (ref 250–450)
UIBC: 176 ug/dL

## 2021-03-26 LAB — HEMOGLOBIN A1C
Hgb A1c MFr Bld: 6.5 % — ABNORMAL HIGH (ref 4.8–5.6)
Mean Plasma Glucose: 139.85 mg/dL

## 2021-03-26 LAB — BASIC METABOLIC PANEL
Anion gap: 10 (ref 5–15)
BUN: 53 mg/dL — ABNORMAL HIGH (ref 6–20)
CO2: 24 mmol/L (ref 22–32)
Calcium: 8.3 mg/dL — ABNORMAL LOW (ref 8.9–10.3)
Chloride: 102 mmol/L (ref 98–111)
Creatinine, Ser: 3.71 mg/dL — ABNORMAL HIGH (ref 0.44–1.00)
GFR, Estimated: 14 mL/min — ABNORMAL LOW (ref >60)
Glucose, Bld: 117 mg/dL — ABNORMAL HIGH (ref 70–99)
Potassium: 4.6 mmol/L (ref 3.5–5.1)
Sodium: 136 mmol/L (ref 135–145)

## 2021-03-26 LAB — FOLATE: Folate: 16.2 ng/mL (ref >5.9)

## 2021-03-26 MED ORDER — PANTOPRAZOLE SODIUM 40 MG PO TBEC
40.0000 mg | DELAYED_RELEASE_TABLET | Freq: Every day | ORAL | Status: DC
  Administered 2021-03-26 – 2021-03-28 (×3): 40 mg via ORAL
  Filled 2021-03-26 (×3): qty 1

## 2021-03-26 MED ORDER — ORAL CARE MOUTH RINSE
15.0000 mL | Freq: Two times a day (BID) | OROMUCOSAL | Status: DC
  Administered 2021-03-26 – 2021-03-27 (×3): 15 mL via OROMUCOSAL

## 2021-03-26 MED ORDER — CEFTRIAXONE SODIUM 2 G IJ SOLR
2.0000 g | INTRAMUSCULAR | Status: DC
  Administered 2021-03-26 – 2021-03-27 (×2): 2 g via INTRAVENOUS
  Filled 2021-03-26 (×2): qty 20
  Filled 2021-03-26: qty 2

## 2021-03-26 MED ORDER — OXYBUTYNIN CHLORIDE 5 MG PO TABS
5.0000 mg | ORAL_TABLET | Freq: Two times a day (BID) | ORAL | Status: DC
  Administered 2021-03-26 – 2021-03-28 (×5): 5 mg via ORAL
  Filled 2021-03-26 (×5): qty 1

## 2021-03-26 MED ORDER — CHLORHEXIDINE GLUCONATE 0.12 % MT SOLN
15.0000 mL | Freq: Two times a day (BID) | OROMUCOSAL | Status: DC
  Administered 2021-03-26 – 2021-03-28 (×5): 15 mL via OROMUCOSAL
  Filled 2021-03-26 (×6): qty 15

## 2021-03-26 MED ORDER — SODIUM CHLORIDE 0.9 % IV SOLN: 2.0000 g | INTRAVENOUS | Status: DC

## 2021-03-26 MED ORDER — SERTRALINE HCL 50 MG PO TABS
25.0000 mg | ORAL_TABLET | Freq: Every day | ORAL | Status: DC
  Administered 2021-03-26 – 2021-03-28 (×3): 25 mg via ORAL
  Filled 2021-03-26 (×3): qty 1

## 2021-03-26 MED ORDER — PNEUMOCOCCAL VAC POLYVALENT 25 MCG/0.5ML IJ INJ
0.5000 mL | INJECTION | INTRAMUSCULAR | Status: AC
  Administered 2021-03-27: 0.5 mL via INTRAMUSCULAR
  Filled 2021-03-26: qty 0.5

## 2021-03-26 MED ORDER — CLONIDINE HCL 0.1 MG PO TABS
0.2000 mg | ORAL_TABLET | Freq: Three times a day (TID) | ORAL | Status: DC
  Administered 2021-03-26 – 2021-03-28 (×7): 0.2 mg via ORAL
  Filled 2021-03-26 (×7): qty 2

## 2021-03-26 MED ORDER — FERROUS SULFATE 325 (65 FE) MG PO TABS
325.0000 mg | ORAL_TABLET | Freq: Two times a day (BID) | ORAL | Status: DC
  Administered 2021-03-26 – 2021-03-28 (×5): 325 mg via ORAL
  Filled 2021-03-26 (×5): qty 1

## 2021-03-26 MED ORDER — GABAPENTIN 300 MG PO CAPS
400.0000 mg | ORAL_CAPSULE | Freq: Every morning | ORAL | Status: DC
  Administered 2021-03-26 – 2021-03-28 (×2): 400 mg via ORAL
  Filled 2021-03-26 (×3): qty 1

## 2021-03-26 MED ORDER — DICLOFENAC SODIUM 1 % EX GEL
4.0000 g | Freq: Two times a day (BID) | CUTANEOUS | Status: DC
  Administered 2021-03-26 – 2021-03-28 (×5): 4 g via TOPICAL
  Filled 2021-03-26: qty 100

## 2021-03-26 NOTE — Progress Notes (Signed)
   03/26/21 0740  Clinical Encounter Type  Visited With Patient  Visit Type Initial;Spiritual support;Social support  Referral From Nurse  Consult/Referral To Chaplain  Spiritual Encounters  Spiritual Needs Prayer;Emotional;Sacred text   Chaplain Curley Spice ministered with a compassionate presence, prayer, and reflective listening. The PT stated she is locally from [place]. She has recently had a few health challenges but is leaning on her lived theology of God being a healer; in which Chaplain supported her with her beliefs.

## 2021-03-26 NOTE — Consult Note (Signed)
[facility]  CONSULT NOTE    Date: 03/26/2021                  Patient Name:  Caitlin Hardin  MRN: [id number]  DOB: [birth year]  Age / Sex: 55 y.o., female         PCP: Bonnita Nasuti, MD                 Service Requesting Consult: [place]                 Reason for Consult: Acute kidney injury            History of Present Illness: Caitlin Hardin is a 55 y.o.  female with depression, anxiety, sleep apnea, hypertension, and obesity, who was admitted to [facility] on 03/25/2021 for Acute cystitis with hematuria [N30.01] AKI (acute kidney injury) [facility]) [N17.9]  Patient presents to ED from her skilled nursing facility for abnormal labs. Outpatient labs indicate a creatinine 4.67. Patient is seen and evaluated at bedside. Alert and fully oriented. Tolerating meals. Denies recent changes in appetite or thirst. Denies nausea and vomiting. States she had diarrhea last night. Denies shortness of breath at this time. States she wears oxygen intermittently outpatient.    Pertinent labs include Creatinine 4.54 with eGFR 11, Na 133, and potassium 4.8. Recent labs on hand appear from 08/31/20 with creatinine of 0.88. UA positive for hematuria, protein and nitrites. CT renal stone negative for obstruction.   Medications: Outpatient medications: Medications Prior to Admission  Medication Sig Dispense Refill Last Dose   allopurinol (ZYLOPRIM) 100 MG tablet Take 100 mg by mouth 2 (two) times daily.   03/25/2021 at 0800   aspirin 81 MG chewable tablet Chew 81 mg by mouth daily.   03/25/2021 at 0800   buPROPion (WELLBUTRIN) 100 MG tablet Take 100 mg by mouth 2 (two) times daily.   03/25/2021 at 0800   calcium carbonate (OS-CAL - DOSED IN MG OF ELEMENTAL CALCIUM) 1250 (500 Ca) MG tablet Take 1 tablet by mouth 2 (two) times daily.   03/25/2021 at 0800   cholecalciferol (VITAMIN D) 25 MCG (1000 UT) tablet Take 1 tablet by mouth daily.   03/25/2021 at 0800   cloNIDine (CATAPRES) 0.2 MG tablet  Take 1 tablet by mouth 3 (three) times daily.   03/25/2021 at 1400   COMBIVENT RESPIMAT 20-100 MCG/ACT AERS respimat Inhale 1 puff into the lungs 4 (four) times daily.   03/25/2021 at 1200   diclofenac Sodium (VOLTAREN) 1 % GEL Apply 4 g topically 2 (two) times daily. To both knees and hips.   03/25/2021 at 0800   enalapril (VASOTEC) 10 MG tablet Take 10 mg by mouth daily.   03/25/2021 at 0800   FEROSUL 325 (65 Fe) MG tablet Take 325 mg by mouth 2 (two) times daily.   03/25/2021 at 0800   fluticasone (FLONASE) 50 MCG/ACT nasal spray Place 2 sprays into both nostrils daily.   03/25/2021 at 0800   gabapentin (NEURONTIN) 300 MG capsule Take 300 mg by mouth 2 (two) times daily. Take at 1400 and 2000   03/25/2021 at 1400   gabapentin (NEURONTIN) 400 MG capsule Take 400 mg by mouth in the morning.   03/25/2021 at 0800   latanoprost (XALATAN) 0.005 % ophthalmic solution Place 1 drop into both eyes at bedtime.   03/24/2021 at 2000   loratadine (CLARITIN) 10 MG tablet Take 10 mg by mouth daily.   03/25/2021 at  0800   magnesium oxide (MAG-OX) 400 MG tablet Take 400 mg by mouth daily.   03/25/2021 at 0800   metFORMIN (GLUCOPHAGE) 500 MG tablet Take 1.5 tablets by mouth 2 (two) times daily.   03/25/2021 at 0800   omeprazole (PRILOSEC) 20 MG capsule Take 20 mg by mouth daily. Take 30 minutes before breakfast   03/25/2021 at 0700   oxybutynin (DITROPAN) 5 MG tablet Take 1 tablet by mouth 2 (two) times daily.   03/25/2021 at 0800   sertraline (ZOLOFT) 50 MG tablet Take 25 mg by mouth daily.   03/25/2021 at 0800   Zinc Oxide 13 % CREA Apply 1 application topically 2 (two) times daily.   03/25/2021 at 0800   acetaminophen (TYLENOL) 325 MG tablet Take 2 tablets by mouth every 6 (six) hours as needed.      azelastine (ASTELIN) 0.1 % nasal spray Place 1 spray into both nostrils daily.      azelastine (OPTIVAR) 0.05 % ophthalmic solution Place 1 drop into both eyes daily.      cetirizine (ZYRTEC) 10 MG tablet Take 10 mg by mouth daily.       enalapril (VASOTEC) 5 MG tablet Take 1 tablet by mouth daily. (Patient not taking: Reported on 03/25/2021)   Not Taking   naproxen sodium (ALEVE) 220 MG tablet Take 220 mg by mouth every 6 (six) hours as needed.      ranitidine (ZANTAC) 150 MG tablet Take 150 mg by mouth 2 (two) times daily. (Patient not taking: No sig reported)   Not Taking   traMADol (ULTRAM) 50 MG tablet Take 50 mg by mouth every 6 (six) hours as needed.       Current medications: Current Facility-Administered Medications  Medication Dose Route Frequency Provider Last Rate Last Admin   acetaminophen (TYLENOL) tablet 650 mg  650 mg Oral Q6H PRN Cox, Amy N, DO       Or   acetaminophen (TYLENOL) suppository 650 mg  650 mg Rectal Q6H PRN Cox, Amy N, DO       buPROPion (WELLBUTRIN) tablet 100 mg  100 mg Oral BID Cox, Amy N, DO   100 mg at 03/26/21 0950   cefTRIAXone (ROCEPHIN) 2 g in sodium chloride 0.9 % 100 mL IVPB  2 g Intravenous Q24H Cox, Amy N, DO       chlorhexidine (PERIDEX) 0.12 % solution 15 mL  15 mL Mouth Rinse BID Cox, Amy N, DO   15 mL at 03/26/21 0950   cloNIDine (CATAPRES) tablet 0.2 mg  0.2 mg Oral TID Cox, Amy N, DO   0.2 mg at 03/26/21 0950   diclofenac Sodium (VOLTAREN) 1 % topical gel 4 g  4 g Topical BID Cox, Amy N, DO   4 g at 03/26/21 9242   ferrous sulfate tablet 325 mg  325 mg Oral BID WC Cox, Amy N, DO   325 mg at 03/26/21 0950   fluticasone (FLONASE) 50 MCG/ACT nasal spray 2 spray  2 spray Each Nare Daily Cox, Amy N, DO   2 spray at 03/26/21 0952   gabapentin (NEURONTIN) capsule 400 mg  400 mg Oral q AM Cox, Amy N, DO   400 mg at 03/26/21 0604   heparin injection 5,000 Units  5,000 Units Subcutaneous Q8H Cox, Amy N, DO   5,000 Units at 03/26/21 0604   insulin aspart (novoLOG) injection 0-5 Units  0-5 Units Subcutaneous QHS Cox, Amy N, DO       insulin aspart (  novoLOG) injection 0-9 Units  0-9 Units Subcutaneous TID WC Cox, Amy N, DO       ipratropium-albuterol (DUONEB) 0.5-2.5 (3) MG/3ML nebulizer  solution 3 mL  3 mL Inhalation Daily Cox, Amy N, DO   3 mL at 03/26/21 1035   MEDLINE mouth rinse  15 mL Mouth Rinse q12n4p Cox, Amy N, DO       ondansetron (ZOFRAN) tablet 4 mg  4 mg Oral Q6H PRN Cox, Amy N, DO       Or   ondansetron (ZOFRAN) injection 4 mg  4 mg Intravenous Q6H PRN Cox, Amy N, DO       oxybutynin (DITROPAN) tablet 5 mg  5 mg Oral BID Cox, Amy N, DO   5 mg at 03/26/21 0950   pantoprazole (PROTONIX) EC tablet 40 mg  40 mg Oral Daily Cox, Amy N, DO   40 mg at 03/26/21 0950   [START ON 03/27/2021] pneumococcal 23 valent vaccine (PNEUMOVAX-23) injection 0.5 mL  0.5 mL Intramuscular Tomorrow-1000 Cox, Amy N, DO       sertraline (ZOLOFT) tablet 25 mg  25 mg Oral Daily Cox, Amy N, DO   25 mg at 03/26/21 0950   traMADol (ULTRAM) tablet 50 mg  50 mg Oral Q6H PRN Cox, Amy N, DO          Allergies: No Known Allergies    Past Medical History: Past Medical History:  Diagnosis Date   Asthma    COPD (chronic obstructive pulmonary disease) ([place])    Gout    Hypertension    Sleep apnea      Past Surgical History: History reviewed. No pertinent surgical history.   Family History: Family History  Problem Relation Age of Onset   Breast cancer Paternal Grandmother      Social History: Social History   Socioeconomic History   Marital status: Single    Spouse name: Not on file   Number of children: Not on file   Years of education: Not on file   Highest education level: Not on file  Occupational History   Not on file  Tobacco Use   Smoking status: Never   Smokeless tobacco: Never  Vaping Use   Vaping Use: Never used  Substance and Sexual Activity   Alcohol use: No   Drug use: No   Sexual activity: Never  Other Topics Concern   Not on file  Social History Narrative   Not on file   Social Determinants of Health   Financial Resource Strain: Not on file  Food Insecurity: Not on file  Transportation Needs: Not on file  Physical Activity: Not on file  Stress:  Not on file  Social Connections: Not on file  Intimate Partner Violence: Not on file     Review of Systems: Review of Systems  Gastrointestinal:  Positive for diarrhea.  All other systems reviewed and are negative.  Vital Signs: Blood pressure 113/63, pulse 73, temperature 98.3 F (36.8 C), resp. rate 18, height 4' 11"  (1.499 m), weight 132.7 kg, SpO2 99 %.  Weight trends: Filed Weights   03/25/21 1740 03/26/21 0043  Weight: 129.7 kg 132.7 kg    Physical Exam: General: NAD, laying in bed  Head: Normocephalic, atraumatic. Moist oral mucosal membranes  Eyes: Anicteric  Lungs:  Clear to auscultation, normal effort  Heart: Regular rate and rhythm  Abdomen:  Soft, nontender  Extremities:  no peripheral edema.  Neurologic: Alert, moving all four extremities  Skin: No lesions  Lab results: Basic Metabolic Panel: Recent Labs  Lab 03/25/21 1753 03/26/21 0504  NA 133* 136  K 4.8 4.6  CL 98 102  CO2 26 24  GLUCOSE 162* 117*  BUN 55* 53*  CREATININE 4.54* 3.71*  CALCIUM 8.8* 8.3*    Liver Function Tests: No results for input(s): AST, ALT, ALKPHOS, BILITOT, PROT, ALBUMIN in the last 168 hours. No results for input(s): LIPASE, AMYLASE in the last 168 hours. No results for input(s): AMMONIA in the last 168 hours.  CBC: Recent Labs  Lab 03/25/21 1753 03/26/21 0504  WBC 13.3* 11.7*  HGB 11.1* 10.4*  HCT 32.8* 30.9*  MCV 96.5 95.7  PLT 206 205    Cardiac Enzymes: No results for input(s): CKTOTAL, CKMB, CKMBINDEX, TROPONINI in the last 168 hours.  BNP: Invalid input(s): POCBNP  CBG: Recent Labs  Lab 03/26/21 0114 03/26/21 0740 03/26/21 1138  GLUCAP 114* 116* 95    Microbiology: Results for orders placed or performed during the hospital encounter of 03/25/21  Resp Panel by RT-PCR (Flu A&B, Covid) Nasopharyngeal Swab     Status: None   Collection Time: 03/25/21  9:33 PM   Specimen: Nasopharyngeal Swab; Nasopharyngeal(NP) swabs in vial transport  medium  Result Value Ref Range Status   SARS Coronavirus 2 by RT PCR NEGATIVE NEGATIVE Final    Comment: (NOTE) SARS-CoV-2 target nucleic acids are NOT DETECTED.  The SARS-CoV-2 RNA is generally detectable in upper respiratory specimens during the acute phase of infection. The lowest concentration of SARS-CoV-2 viral copies this assay can detect is 138 copies/mL. A negative result does not preclude SARS-Cov-2 infection and should not be used as the sole basis for treatment or other patient management decisions. A negative result may occur with  improper specimen collection/handling, submission of specimen other than nasopharyngeal swab, presence of viral mutation(s) within the areas targeted by this assay, and inadequate number of viral copies(<138 copies/mL). A negative result must be combined with clinical observations, patient history, and epidemiological information. The expected result is Negative.  Fact Sheet for Patients:  EntrepreneurPulse.com.au  Fact Sheet for Healthcare Providers:  IncredibleEmployment.be  This test is no t yet approved or cleared by the [place] FDA and  has been authorized for detection and/or diagnosis of SARS-CoV-2 by FDA under an Emergency Use Authorization (EUA). This EUA will remain  in effect (meaning this test can be used) for the duration of the COVID-19 declaration under Section 564(b)(1) of the Act, 21 U.S.C.section 360bbb-3(b)(1), unless the authorization is terminated  or revoked sooner.       Influenza A by PCR NEGATIVE NEGATIVE Final   Influenza B by PCR NEGATIVE NEGATIVE Final    Comment: (NOTE) The Xpert Xpress SARS-CoV-2/FLU/RSV plus assay is intended as an aid in the diagnosis of influenza from Nasopharyngeal swab specimens and should not be used as a sole basis for treatment. Nasal washings and aspirates are unacceptable for Xpert Xpress SARS-CoV-2/FLU/RSV testing.  Fact Sheet for  Patients: EntrepreneurPulse.com.au  Fact Sheet for Healthcare Providers: IncredibleEmployment.be  This test is not yet approved or cleared by the [place] FDA and has been authorized for detection and/or diagnosis of SARS-CoV-2 by FDA under an Emergency Use Authorization (EUA). This EUA will remain in effect (meaning this test can be used) for the duration of the COVID-19 declaration under Section 564(b)(1) of the Act, 21 U.S.C. section 360bbb-3(b)(1), unless the authorization is terminated or revoked.  Performed at [facility], [address] [place], [place] [address]  Coagulation Studies: No results for input(s): LABPROT, INR in the last 72 hours.  Urinalysis: Recent Labs    03/25/21 1756  COLORURINE YELLOW*  LABSPEC 1.015  PHURINE 5.5  GLUCOSEU NEGATIVE  HGBUR SMALL*  BILIRUBINUR NEGATIVE  KETONESUR NEGATIVE  PROTEINUR 30*  NITRITE POSITIVE*  LEUKOCYTESUR LARGE*      Imaging: CT Renal Stone Study  Result Date: 03/25/2021 CLINICAL DATA:  Urinary tract stone, symptomatic/complicated. Elevated serum creatinine. EXAM: CT ABDOMEN AND PELVIS WITHOUT CONTRAST TECHNIQUE: Multidetector CT imaging of the abdomen and pelvis was performed following the standard protocol without IV contrast. COMPARISON:  None. FINDINGS: Lower chest: The visualized lung bases are clear bilaterally. The visualized heart and pericardium are unremarkable. Hepatobiliary: Mild to moderate hepatic steatosis. No definite intrahepatic mass on this noncontrast examination. No intra or extrahepatic biliary ductal dilation. Gallbladder unremarkable. Pancreas: Unremarkable Spleen: Unremarkable Adrenals/Urinary Tract: Adrenal glands are unremarkable. Kidneys are normal, without renal calculi, focal lesion, or hydronephrosis. Bladder is unremarkable. Stomach/Bowel: Stomach is within normal limits. Appendix appears normal. No evidence of bowel wall thickening,  distention, or inflammatory changes. No free intraperitoneal gas or fluid. Vascular/Lymphatic: Mild aortoiliac atherosclerotic calcification. No aortic aneurysm. No pathologic adenopathy within the abdomen and pelvis. Reproductive: Uterus and bilateral adnexa are unremarkable. Other: No abdominal wall hernia.  The rectum is unremarkable. Musculoskeletal: No acute bone abnormality. Degenerative changes are seen within the lumbar spine. No lytic or blastic bone lesion. IMPRESSION: No acute intra-abdominal pathology identified. No definite radiographic explanation for the patient's reported symptoms. Mild to moderate hepatic steatosis. Aortic Atherosclerosis (ICD10-I70.0). Electronically Signed   By: Fidela Salisbury M.D.   On: 03/25/2021 21:56     Assessment & Plan: Caitlin Hardin is a 55 y.o.  female with depression, anxiety, sleep apnea, hypertension, and obesity, who was admitted to [facility] on 03/25/2021 for Acute cystitis with hematuria [N30.01] AKI (acute kidney injury) ([place]) [N17.9]   Acute kidney injury likely due to dehydration caused by urinary tract infection. Baseline creatinine 0.88 in February 2020. Creatinine improved overnight with IVF to 3.71 from 4.54. Receiving IV antibiotics. Maintains good oral intake. No IV contrast exposure. Enalapril and Metformin held. CT renal stone negative for obstruction, normal appearing kidneys. Continue IVF. Will monitor renal function  Diabetes mellitus type II, unspecified noninsulin dependent. Home regimen includes Metformin. Most recent hemoglobin A1c is 6.5 on 03/25/21. Metformin held.   Stable at this time  Hypertension Home regimen includes Clonidine and enalapril. Enalapril currently held. Receiving Clonidine       LOS: 0 Ralph Brouwer 9/3/20222:09 PM

## 2021-03-26 NOTE — Progress Notes (Signed)
[facility] Progress Note  Patient: Caitlin Hardin    QQI:[id number]  DOA: 03/25/2021     Date of Service: the patient was seen and examined on 03/26/2021  Chief Complaint  Patient presents with   Abnormal Lab   Chest Pain   Brief hospital course: Caitlin Hardin is a 55 y.o. female with medical history significant for obesity, depression, anxiety, hypertension, obstructive sleep apnea, presents emergency department for chief concerns of abnormal labs from [place] facility.   At bedside, she was able to tell me her name, age, location, and current calendar year. She reports poor choices in diet.    She reports that the facility was taking any annual labs of everyone when they noted to the patient that she had an abnormal kidney function.  She denies recent changes to medications in her diet.  She reports that dysuria and dark urine in the last few days.   She reports her blood glucose was 235 on Wednesday night. That scared her and she has been feeling stressed since.    Assessment and Plan: Principal Problem:   AKI (acute kidney injury) (HCC) Active Problems:   Acute lower UTI   OSA (obstructive sleep apnea)   Essential hypertension   Depression   Diabetes mellitus type 2, noninsulin dependent (HCC)   Obesity, diabetes, and hypertension syndrome (HCC)   Neutrophilic leukocytosis   # Acute kidney injury-most likely due to dehydration, prerenal CT a/p No acute intra-abdominal pathology identified. No definite radiographic explanation for the patient's reported symptoms. Mild to moderate hepatic steatosis. - No CKD on baseline, most recent serum creatinine was 0.88 in February 2020 - s/p NS 1 L bolus, continue NS 125 mL/h per EDP -Holding home naproxen and enalapril --Creatinine 4.54--3.71 gradually improving -BMP daily  Nephrology consulted    # UTI, UA positive S/p  ceftriaxone 2 g IV,, Continue ceftriaxone Follow urine culture   # Hypertension-resumed home  clonidine 0.2 mg 3 times daily -Holding home enalapril 10 mg daily due to acute kidney injury   # Neuropathy-gabapentin 400 mg in the a.m., 300 mg twice daily   # Non-insulin-dependent diabetes mellitus-holding home metformin 1500 mg twice daily -Insulin SSI with at bedtime coverage ordered HbA1c 6.5, well controlled  # GERD-PPI   # Urinary incontinence-oxybutynin 5 mg twice daily   # Depression/anxiety-resumed home bupropion 100 mg twice daily, sertraline 50 mg daily   # Generalized pain-tramadol 50 mg every 6 hours as needed for moderate pain, 3 doses ordered   # Gout-in remission, holding home allopurinol due to acute kidney injury at this time   #Chronic respiratory failure on 2 L oxygen at baseline # OSA-CPAP nightly ordered    Body mass index is 59.09 kg/m.  Interventions:        Diet: Carb modified/heart healthy DVT Prophylaxis: Subcutaneous Heparin    Advance goals of care discussion: Full code  Family Communication: family was NOT present at bedside, at the time of interview.  The pt provided permission to discuss medical plan with the family. Opportunity was given to ask question and all questions were answered satisfactorily.   Disposition:  Pt is from Home, admitted with AKI and UTI, still has AKI, which precludes a safe discharge. Discharge to Home, when AKI resolves and cleared by nephrology..  Subjective: No significant overnight events, patient was admitted due to AKI and UTI, patient has little bit symptoms of dysuria but no any other active issues.  Denied any abdominal pain, no nausea vomiting  or diarrhea, denied any shortness of breath, no chest palpitations.     Physical Exam: General:  alert oriented to time, place, and person.  Appear in no distress, affect appropriate Eyes: PERRLA ENT: Oral Mucosa Clear, moist  Neck: no JVD,  Cardiovascular: S1 and S2 Present, no Murmur,  Respiratory: good respiratory effort, Bilateral Air entry equal and  Decreased, no Crackles, no wheezes Abdomen: Bowel Sound present, Soft and no tenderness,  Skin: no rashes Extremities: non Pedal edema,  calf tenderness Neurologic: without any new focal findings Gait not checked due to patient safety concerns  Vitals:   03/25/21 2330 03/26/21 0043 03/26/21 0603 03/26/21 0736  BP: 104/66 103/64 113/60 113/63  Pulse: 78 72 78 73  Resp: 20 16 16 18   Temp:  97.9 F (36.6 C) 98.2 F (36.8 C) 98.3 F (36.8 C)  TempSrc:  Oral Oral   SpO2: 97% 100% 100% 99%  Weight:  132.7 kg    Height:  4\' 11"  (1.499 m)      Intake/Output Summary (Last 24 hours) at 03/26/2021 1334 Last data filed at 03/26/2021 0930 Gross per 24 hour  Intake 1100 ml  Output 2000 ml  Net -900 ml   Filed Weights   03/25/21 1740 03/26/21 0043  Weight: 129.7 kg 132.7 kg    Data Reviewed: I have personally reviewed and interpreted daily labs, tele strips, imagings as discussed above. I reviewed all nursing notes, pharmacy notes, vitals, pertinent old records I have discussed plan of care as described above with RN and patient/family.  CBC: Recent Labs  Lab 03/25/21 1753 03/26/21 0504  WBC 13.3* 11.7*  HGB 11.1* 10.4*  HCT 32.8* 30.9*  MCV 96.5 95.7  PLT 206 205   Basic Metabolic Panel: Recent Labs  Lab 03/25/21 1753 03/26/21 0504  NA 133* 136  K 4.8 4.6  CL 98 102  CO2 26 24  GLUCOSE 162* 117*  BUN 55* 53*  CREATININE 4.54* 3.71*  CALCIUM 8.8* 8.3*    Studies: CT Renal Stone Study  Result Date: 03/25/2021 CLINICAL DATA:  Urinary tract stone, symptomatic/complicated. Elevated serum creatinine. EXAM: CT ABDOMEN AND PELVIS WITHOUT CONTRAST TECHNIQUE: Multidetector CT imaging of the abdomen and pelvis was performed following the standard protocol without IV contrast. COMPARISON:  None. FINDINGS: Lower chest: The visualized lung bases are clear bilaterally. The visualized heart and pericardium are unremarkable. Hepatobiliary: Mild to moderate hepatic steatosis. No  definite intrahepatic mass on this noncontrast examination. No intra or extrahepatic biliary ductal dilation. Gallbladder unremarkable. Pancreas: Unremarkable Spleen: Unremarkable Adrenals/Urinary Tract: Adrenal glands are unremarkable. Kidneys are normal, without renal calculi, focal lesion, or hydronephrosis. Bladder is unremarkable. Stomach/Bowel: Stomach is within normal limits. Appendix appears normal. No evidence of bowel wall thickening, distention, or inflammatory changes. No free intraperitoneal gas or fluid. Vascular/Lymphatic: Mild aortoiliac atherosclerotic calcification. No aortic aneurysm. No pathologic adenopathy within the abdomen and pelvis. Reproductive: Uterus and bilateral adnexa are unremarkable. Other: No abdominal wall hernia.  The rectum is unremarkable. Musculoskeletal: No acute bone abnormality. Degenerative changes are seen within the lumbar spine. No lytic or blastic bone lesion. IMPRESSION: No acute intra-abdominal pathology identified. No definite radiographic explanation for the patient's reported symptoms. Mild to moderate hepatic steatosis. Aortic Atherosclerosis (ICD10-I70.0). Electronically Signed   By: 05/26/21 M.D.   On: 03/25/2021 21:56    Scheduled Meds:  buPROPion  100 mg Oral BID   chlorhexidine  15 mL Mouth Rinse BID   cloNIDine  0.2 mg Oral TID   diclofenac  Sodium  4 g Topical BID   ferrous sulfate  325 mg Oral BID WC   fluticasone  2 spray Each Nare Daily   gabapentin  400 mg Oral q AM   heparin  5,000 Units Subcutaneous Q8H   insulin aspart  0-5 Units Subcutaneous QHS   insulin aspart  0-9 Units Subcutaneous TID WC   ipratropium-albuterol  3 mL Inhalation Daily   mouth rinse  15 mL Mouth Rinse q12n4p   oxybutynin  5 mg Oral BID   pantoprazole  40 mg Oral Daily   [START ON 03/27/2021] pneumococcal 23 valent vaccine  0.5 mL Intramuscular Tomorrow-1000   sertraline  25 mg Oral Daily   Continuous Infusions:  cefTRIAXone (ROCEPHIN)  IV     PRN Meds:  acetaminophen **OR** acetaminophen, ondansetron **OR** ondansetron (ZOFRAN) IV, traMADol  Time spent: 35 minutes  Author: Gillis Santa. MD [facility] 03/26/2021 1:34 PM  To reach On-call, see care teams to locate the attending and reach out to them via [email or web address]. If 7PM-7AM, please contact night-coverage If you still have difficulty reaching the attending provider, please page the [facility] (Director on Call) for [facility] on amion for assistance.

## 2021-03-26 NOTE — ED Notes (Signed)
Patient transported to room 216 via hospital bed by transport, stable at transfer.

## 2021-03-27 LAB — CBC
HCT: 30.6 % — ABNORMAL LOW (ref 36.0–46.0)
Hemoglobin: 10.3 g/dL — ABNORMAL LOW (ref 12.0–15.0)
MCH: 32.5 pg (ref 26.0–34.0)
MCHC: 33.7 g/dL (ref 30.0–36.0)
MCV: 96.5 fL (ref 80.0–100.0)
Platelets: 222 10*3/uL (ref 150–400)
RBC: 3.17 MIL/uL — ABNORMAL LOW (ref 3.87–5.11)
RDW: 13.4 % (ref 11.5–15.5)
WBC: 7.3 10*3/uL (ref 4.0–10.5)
nRBC: 0 % (ref 0.0–0.2)

## 2021-03-27 LAB — BASIC METABOLIC PANEL
Anion gap: 7 (ref 5–15)
BUN: 38 mg/dL — ABNORMAL HIGH (ref 6–20)
CO2: 24 mmol/L (ref 22–32)
Calcium: 9 mg/dL (ref 8.9–10.3)
Chloride: 108 mmol/L (ref 98–111)
Creatinine, Ser: 2.17 mg/dL — ABNORMAL HIGH (ref 0.44–1.00)
GFR, Estimated: 26 mL/min — ABNORMAL LOW (ref >60)
Glucose, Bld: 109 mg/dL — ABNORMAL HIGH (ref 70–99)
Potassium: 5 mmol/L (ref 3.5–5.1)
Sodium: 139 mmol/L (ref 135–145)

## 2021-03-27 LAB — GLUCOSE, CAPILLARY
Glucose-Capillary: 102 mg/dL — ABNORMAL HIGH (ref 70–99)
Glucose-Capillary: 102 mg/dL — ABNORMAL HIGH (ref 70–99)
Glucose-Capillary: 93 mg/dL (ref 70–99)
Glucose-Capillary: 133 mg/dL — ABNORMAL HIGH (ref 70–99)

## 2021-03-27 LAB — HIV ANTIBODY (ROUTINE TESTING W REFLEX): HIV Screen 4th Generation wRfx: NONREACTIVE

## 2021-03-27 LAB — VITAMIN D 25 HYDROXY (VIT D DEFICIENCY, FRACTURES): Vit D, 25-Hydroxy: 29.81 ng/mL — ABNORMAL LOW (ref 30–100)

## 2021-03-27 LAB — MAGNESIUM: Magnesium: 2.3 mg/dL (ref 1.7–2.4)

## 2021-03-27 LAB — VITAMIN B12: Vitamin B-12: 478 pg/mL (ref 180–914)

## 2021-03-27 LAB — PHOSPHORUS: Phosphorus: 4.8 mg/dL — ABNORMAL HIGH (ref 2.5–4.6)

## 2021-03-27 MED ORDER — SODIUM CHLORIDE 0.9 % IV SOLN: INTRAVENOUS | Status: AC

## 2021-03-27 NOTE — TOC Initial Note (Signed)
Transition of Care [facility]) - Initial/Assessment Note    Patient Details  Name: Caitlin Hardin MRN: [id number] Date of Birth: 22-Jul-1966  Transition of Care [facility]) CM/SW Contact:    Liliana Cline, LCSW Phone Number: 03/27/2021, 12:53 PM  Clinical Narrative:                CSW spoke to patient regarding DC planning. Patient confirmed she lives at [facility], has lived there for about 5 years. No DME. Patient had HH in the past, could not recall agency used,said it was about 1.5 years ago. [facility] provides transportation to appointments. Patient denies needs at this time. TOC will follow for transition back to [facility] when patient is medically ready.    Expected Discharge Plan: Assisted Living Barriers to Discharge: Continued Medical Work up   Patient Goals and CMS Choice Patient states their goals for this hospitalization and ongoing recovery are:: back to ALF CMS Medicare.gov Compare Post Acute Care list provided to:: Patient Choice offered to / list presented to : Patient  Expected Discharge Plan and Services Expected Discharge Plan: Assisted Living       Living arrangements for the past 2 months: Assisted Living Facility                                      Prior Living Arrangements/Services Living arrangements for the past 2 months: Assisted Living Facility Lives with:: Facility Resident Patient language and need for interpreter reviewed:: Yes Do you feel safe going back to the place where you live?: Yes      Need for Family Participation in Patient Care: Yes (Comment) Care giver support system in place?: Yes (comment)   Criminal Activity/Legal Involvement Pertinent to Current Situation/Hospitalization: No - Comment as needed  Activities of Daily Living Home Assistive Devices/Equipment: Shower chair with back ADL Screening (condition at time of admission) Patient's cognitive ability adequate to safely complete daily activities?: Yes Is the patient  deaf or have difficulty hearing?: No Does the patient have difficulty seeing, even when wearing glasses/contacts?: No Does the patient have difficulty concentrating, remembering, or making decisions?: No Patient able to express need for assistance with ADLs?: Yes Does the patient have difficulty dressing or bathing?: No Independently performs ADLs?: Yes (appropriate for developmental age) Does the patient have difficulty walking or climbing stairs?: Yes Weakness of Legs: Both Weakness of Arms/Hands: None  Permission Sought/Granted Permission sought to share information with : Facility Industrial/product designer granted to share information with : Yes, Verbal Permission Granted     Permission granted to share info w AGENCY: [facility], HH/DME agencies if needed        Emotional Assessment       Orientation: : Oriented to Self, Oriented to Place, Oriented to  Time, Oriented to Situation Alcohol / Substance Use: Not Applicable Psych Involvement: No (comment)  Admission diagnosis:  Acute cystitis with hematuria [N30.01] AKI (acute kidney injury) (HCC) [N17.9] Patient Active Problem List   Diagnosis Date Noted   AKI (acute kidney injury) (HCC) 03/25/2021   Acute lower UTI 03/25/2021   OSA (obstructive sleep apnea) 03/25/2021   Essential hypertension 03/25/2021   Depression 03/25/2021   Diabetes mellitus type 2, noninsulin dependent (HCC) 03/25/2021   Obesity, diabetes, and hypertension syndrome (HCC) 03/25/2021   Neutrophilic leukocytosis 03/25/2021   PCP:  Galvin Proffer, MD Pharmacy:  No Pharmacies Listed    Social Determinants of  Health (SDOH) Interventions    Readmission Risk Interventions Readmission Risk Prevention Plan 03/27/2021  Post Dischage Appt Complete  Medication Screening Complete  Transportation Screening Complete  Some recent data might be hidden

## 2021-03-27 NOTE — Progress Notes (Addendum)
[facility] Progress Note  Patient: Caitlin Hardin    OLM:[id number]  DOA: 03/25/2021     Date of Service: the patient was seen and examined on 03/27/2021  Chief Complaint  Patient presents with   Abnormal Lab   Chest Pain   Brief hospital course: Caitlin Hardin is a 55 y.o. female with medical history significant for obesity, depression, anxiety, hypertension, obstructive sleep apnea, presents emergency department for chief concerns of abnormal labs from [facility] facility.   At bedside, she was able to tell me her name, age, location, and current calendar year. She reports poor choices in diet.    She reports that the facility was taking any annual labs of everyone when they noted to the patient that she had an abnormal kidney function.  She denies recent changes to medications in her diet.  She reports that dysuria and dark urine in the last few days.   She reports her blood glucose was 235 on Wednesday night. That scared her and she has been feeling stressed since.    Assessment and Plan: Principal Problem:   AKI (acute kidney injury) (HCC) Active Problems:   Acute lower UTI   OSA (obstructive sleep apnea)   Essential hypertension   Depression   Diabetes mellitus type 2, noninsulin dependent (HCC)   Obesity, diabetes, and hypertension syndrome (HCC)   Neutrophilic leukocytosis   # Acute kidney injury-most likely due to dehydration, prerenal CT a/p No acute intra-abdominal pathology identified. No definite radiographic explanation for the patient's reported symptoms. Mild to moderate hepatic steatosis. - No CKD on baseline, most recent serum creatinine was 0.88 in February 2020 - s/p NS 1 L bolus, continue NS 125 mL/h per EDP -Holding home naproxen and enalapril --Creatinine 4.54--3.71--2.17 gradually improving 9/4 K 5.0 started low potassium diet,  Nephrology consulted    # UTI, UA positive S/p  ceftriaxone 2 g IV,, Continue ceftriaxone Follow urine culture  growing GNR, follow complete report   # Hypertension-resumed home clonidine 0.2 mg 3 times daily -Holding home enalapril 10 mg daily due to acute kidney injury   # Neuropathy-gabapentin 400 mg in the a.m., 300 mg twice daily   # Non-insulin-dependent diabetes mellitus-holding home metformin 1500 mg twice daily -Insulin SSI with at bedtime coverage ordered HbA1c 6.5, well controlled  # GERD-PPI   # Urinary incontinence-oxybutynin 5 mg twice daily   # Depression/anxiety-resumed home bupropion 100 mg twice daily, sertraline 50 mg daily   # Generalized pain-tramadol 50 mg every 6 hours as needed for moderate pain, 3 doses ordered   # Gout-in remission, holding home allopurinol due to acute kidney injury at this time   #Chronic respiratory failure on 2 L oxygen at baseline # OSA-CPAP nightly ordered #Vitamin D level 30, started oral supplement to prevent from deficiency    Body mass index is 59.09 kg/m.  Interventions:        Diet: Carb modified/heart healthy DVT Prophylaxis: Subcutaneous Heparin    Advance goals of care discussion: Full code  Family Communication: family was NOT present at bedside, at the time of interview.  The pt provided permission to discuss medical plan with the family. Opportunity was given to ask question and all questions were answered satisfactorily.   Disposition:  Pt is from Home, admitted with AKI and UTI, still has AKI, which precludes a safe discharge. Discharge to Home, when AKI resolves and cleared by nephrology..  Subjective: No significant overnight events, patient denied any abdominal pain, no dysuria, denied  any shortness of breath, no chest pain or palpitation.  Patient denied any nausea vomiting or diarrhea.  Physical Exam: General:  alert oriented to time, place, and person.  Appear in no distress, affect appropriate Eyes: PERRLA ENT: Oral Mucosa Clear, moist  Neck: no JVD,  Cardiovascular: S1 and S2 Present, no Murmur,   Respiratory: good respiratory effort, Bilateral Air entry equal and Decreased, no Crackles, no wheezes Abdomen: Bowel Sound present, Soft and no tenderness,  Skin: no rashes Extremities: non Pedal edema,  calf tenderness Neurologic: without any new focal findings Gait not checked due to patient safety concerns  Vitals:   03/26/21 1517 03/26/21 2232 03/27/21 0448 03/27/21 0741  BP: 130/66 128/65 116/61 (!) 143/66  Pulse: 76  68 70  Resp: 18  20 18   Temp: 98.2 F (36.8 C)  97.7 F (36.5 C) 98.1 F (36.7 C)  TempSrc:   Oral Oral  SpO2: 97%  100% 99%  Weight:      Height:        Intake/Output Summary (Last 24 hours) at 03/27/2021 1304 Last data filed at 03/27/2021 0521 Gross per 24 hour  Intake --  Output 0 ml  Net 0 ml   Filed Weights   03/25/21 1740 03/26/21 0043  Weight: 129.7 kg 132.7 kg    Data Reviewed: I have personally reviewed and interpreted daily labs, tele strips, imagings as discussed above. I reviewed all nursing notes, pharmacy notes, vitals, pertinent old records I have discussed plan of care as described above with RN and patient/family.  CBC: Recent Labs  Lab 03/25/21 1753 03/26/21 0504 03/27/21 0448  WBC 13.3* 11.7* 7.3  HGB 11.1* 10.4* 10.3*  HCT 32.8* 30.9* 30.6*  MCV 96.5 95.7 96.5  PLT 206 205 222   Basic Metabolic Panel: Recent Labs  Lab 03/25/21 1753 03/26/21 0504 03/27/21 0448  NA 133* 136 139  K 4.8 4.6 5.0  CL 98 102 108  CO2 26 24 24   GLUCOSE 162* 117* 109*  BUN 55* 53* 38*  CREATININE 4.54* 3.71* 2.17*  CALCIUM 8.8* 8.3* 9.0  MG  --   --  2.3  PHOS  --   --  4.8*    Studies: No results found.  Scheduled Meds:  buPROPion  100 mg Oral BID   chlorhexidine  15 mL Mouth Rinse BID   cloNIDine  0.2 mg Oral TID   diclofenac Sodium  4 g Topical BID   ferrous sulfate  325 mg Oral BID WC   fluticasone  2 spray Each Nare Daily   gabapentin  400 mg Oral q AM   heparin  5,000 Units Subcutaneous Q8H   insulin aspart  0-5 Units  Subcutaneous QHS   insulin aspart  0-9 Units Subcutaneous TID WC   ipratropium-albuterol  3 mL Inhalation Daily   mouth rinse  15 mL Mouth Rinse q12n4p   oxybutynin  5 mg Oral BID   pantoprazole  40 mg Oral Daily   sertraline  25 mg Oral Daily   Continuous Infusions:  sodium chloride 75 mL/hr at 03/27/21 0956   cefTRIAXone (ROCEPHIN)  IV 2 g (03/26/21 2235)   PRN Meds: acetaminophen **OR** acetaminophen, ondansetron **OR** ondansetron (ZOFRAN) IV, traMADol  Time spent: 35 minutes  Author: 05/27/21. MD [facility] 03/27/2021 1:04 PM  To reach On-call, see care teams to locate the attending and reach out to them via [email or web address]. If 7PM-7AM, please contact night-coverage If you still have difficulty reaching the attending provider, please page  the [facility] (Director on Call) for [facility] on amion for assistance.

## 2021-03-27 NOTE — Progress Notes (Signed)
[facility]  ROUNDING NOTE   Subjective:   Caitlin Hardin is a 55 y.o. female admitted for Acute cystitis with hematuria [N30.01] AKI (acute kidney injury) (HCC) [N17.9]  Patient seen OOB in room Currently in bathroom brushing teeth Tolerating meals, denies nausea and vomiting Denies shortness of breath States she feels much better, didn't realize she felt bad until now   Objective:  Vital signs in last 24 hours:  Temp:  [97.7 F (36.5 C)-98.2 F (36.8 C)] 98.1 F (36.7 C) (09/04 0741) Pulse Rate:  [68-76] 70 (09/04 0741) Resp:  [18-20] 18 (09/04 0741) BP: (116-143)/(61-66) 143/66 (09/04 0741) SpO2:  [97 %-100 %] 99 % (09/04 0741)  Weight change:  Filed Weights   03/25/21 1740 03/26/21 0043  Weight: 129.7 kg 132.7 kg    Intake/Output: I/O last 3 completed shifts: In: 1100 [IV Piggyback:1100] Out: 2000 [Urine:2000]   Intake/Output this shift:  No intake/output data recorded.  Physical Exam: General: NAD  Head: Normocephalic, atraumatic. Moist oral mucosal membranes  Eyes: Anicteric  Lungs:  Clear to auscultation, normal effort, O2 2L  Heart: Regular rate and rhythm  Abdomen:  Soft, nontender  Extremities:  no peripheral edema.  Neurologic: Nonfocal, moving all four extremities  Skin: No lesions       Basic Metabolic Panel: Recent Labs  Lab 03/25/21 1753 03/26/21 0504 03/27/21 0448  NA 133* 136 139  K 4.8 4.6 5.0  CL 98 102 108  CO2 26 24 24   GLUCOSE 162* 117* 109*  BUN 55* 53* 38*  CREATININE 4.54* 3.71* 2.17*  CALCIUM 8.8* 8.3* 9.0  MG  --   --  2.3  PHOS  --   --  4.8*    Liver Function Tests: No results for input(s): AST, ALT, ALKPHOS, BILITOT, PROT, ALBUMIN in the last 168 hours. No results for input(s): LIPASE, AMYLASE in the last 168 hours. No results for input(s): AMMONIA in the last 168 hours.  CBC: Recent Labs  Lab 03/25/21 1753 03/26/21 0504 03/27/21 0448  WBC 13.3* 11.7* 7.3  HGB 11.1* 10.4* 10.3*  HCT 32.8*  30.9* 30.6*  MCV 96.5 95.7 96.5  PLT 206 205 222    Cardiac Enzymes: No results for input(s): CKTOTAL, CKMB, CKMBINDEX, TROPONINI in the last 168 hours.  BNP: Invalid input(s): POCBNP  CBG: Recent Labs  Lab 03/26/21 0740 03/26/21 1138 03/26/21 1546 03/26/21 2040 03/27/21 0743  GLUCAP 116* 95 128* 112* 102*    Microbiology: Results for orders placed or performed during the hospital encounter of 03/25/21  Urine Culture     Status: Abnormal (Preliminary result)   Collection Time: 03/25/21  5:56 PM   Specimen: Urine, Random  Result Value Ref Range Status   Specimen Description   Final    URINE, RANDOM Performed at [facility], [address], [place], [place]    Special Requests   Final    NONE Performed at [facility], [address], [place], [place]    Culture (A)  Final    >=100,000 COLONIES/mL GRAM NEGATIVE RODS CULTURE REINCUBATED FOR BETTER GROWTH Performed at [facility], [address] [place], [place]    Report Status PENDING  Incomplete  Resp Panel by RT-PCR (Flu A&B, Covid) Nasopharyngeal Swab     Status: None   Collection Time: 03/25/21  9:33 PM   Specimen: Nasopharyngeal Swab; Nasopharyngeal(NP) swabs in vial transport medium  Result Value Ref Range Status   SARS Coronavirus 2  by RT PCR NEGATIVE NEGATIVE Final    Comment: (NOTE) SARS-CoV-2 target nucleic acids are NOT DETECTED.  The SARS-CoV-2 RNA is generally detectable in upper respiratory specimens during the acute phase of infection. The lowest concentration of SARS-CoV-2 viral copies this assay can detect is 138 copies/mL. A negative result does not preclude SARS-Cov-2 infection and should not be used as the sole basis for treatment or other patient management decisions. A negative result may occur with  improper specimen collection/handling, submission of specimen other than nasopharyngeal swab, presence of viral mutation(s) within  the areas targeted by this assay, and inadequate number of viral copies(<138 copies/mL). A negative result must be combined with clinical observations, patient history, and epidemiological information. The expected result is Negative.  Fact Sheet for Patients:  BloggerCourse.com  Fact Sheet for Healthcare Providers:  SeriousBroker.it  This test is no t yet approved or cleared by the [place] FDA and  has been authorized for detection and/or diagnosis of SARS-CoV-2 by FDA under an Emergency Use Authorization (EUA). This EUA will remain  in effect (meaning this test can be used) for the duration of the COVID-19 declaration under Section 564(b)(1) of the Act, 21 U.S.C.section 360bbb-3(b)(1), unless the authorization is terminated  or revoked sooner.       Influenza A by PCR NEGATIVE NEGATIVE Final   Influenza B by PCR NEGATIVE NEGATIVE Final    Comment: (NOTE) The Xpert Xpress SARS-CoV-2/FLU/RSV plus assay is intended as an aid in the diagnosis of influenza from Nasopharyngeal swab specimens and should not be used as a sole basis for treatment. Nasal washings and aspirates are unacceptable for Xpert Xpress SARS-CoV-2/FLU/RSV testing.  Fact Sheet for Patients: BloggerCourse.com  Fact Sheet for Healthcare Providers: SeriousBroker.it  This test is not yet approved or cleared by the [place] FDA and has been authorized for detection and/or diagnosis of SARS-CoV-2 by FDA under an Emergency Use Authorization (EUA). This EUA will remain in effect (meaning this test can be used) for the duration of the COVID-19 declaration under Section 564(b)(1) of the Act, 21 U.S.C. section 360bbb-3(b)(1), unless the authorization is terminated or revoked.  Performed at [facility], [address] [place], [place] [address]     Coagulation Studies: No results for input(s):  LABPROT, INR in the last 72 hours.  Urinalysis: Recent Labs    03/25/21 1756  COLORURINE YELLOW*  LABSPEC 1.015  PHURINE 5.5  GLUCOSEU NEGATIVE  HGBUR SMALL*  BILIRUBINUR NEGATIVE  KETONESUR NEGATIVE  PROTEINUR 30*  NITRITE POSITIVE*  LEUKOCYTESUR LARGE*      Imaging: CT Renal Stone Study  Result Date: 03/25/2021 CLINICAL DATA:  Urinary tract stone, symptomatic/complicated. Elevated serum creatinine. EXAM: CT ABDOMEN AND PELVIS WITHOUT CONTRAST TECHNIQUE: Multidetector CT imaging of the abdomen and pelvis was performed following the standard protocol without IV contrast. COMPARISON:  None. FINDINGS: Lower chest: The visualized lung bases are clear bilaterally. The visualized heart and pericardium are unremarkable. Hepatobiliary: Mild to moderate hepatic steatosis. No definite intrahepatic mass on this noncontrast examination. No intra or extrahepatic biliary ductal dilation. Gallbladder unremarkable. Pancreas: Unremarkable Spleen: Unremarkable Adrenals/Urinary Tract: Adrenal glands are unremarkable. Kidneys are normal, without renal calculi, focal lesion, or hydronephrosis. Bladder is unremarkable. Stomach/Bowel: Stomach is within normal limits. Appendix appears normal. No evidence of bowel wall thickening, distention, or inflammatory changes. No free intraperitoneal gas or fluid. Vascular/Lymphatic: Mild aortoiliac atherosclerotic calcification. No aortic aneurysm. No pathologic adenopathy within the abdomen and pelvis. Reproductive: Uterus and bilateral adnexa are unremarkable. Other: No  abdominal wall hernia.  The rectum is unremarkable. Musculoskeletal: No acute bone abnormality. Degenerative changes are seen within the lumbar spine. No lytic or blastic bone lesion. IMPRESSION: No acute intra-abdominal pathology identified. No definite radiographic explanation for the patient's reported symptoms. Mild to moderate hepatic steatosis. Aortic Atherosclerosis (ICD10-I70.0). Electronically Signed    By: Helyn Numbers M.D.   On: 03/25/2021 21:56     Medications:    sodium chloride 75 mL/hr at 03/27/21 0956   cefTRIAXone (ROCEPHIN)  IV 2 g (03/26/21 2235)    buPROPion  100 mg Oral BID   chlorhexidine  15 mL Mouth Rinse BID   cloNIDine  0.2 mg Oral TID   diclofenac Sodium  4 g Topical BID   ferrous sulfate  325 mg Oral BID WC   fluticasone  2 spray Each Nare Daily   gabapentin  400 mg Oral q AM   heparin  5,000 Units Subcutaneous Q8H   insulin aspart  0-5 Units Subcutaneous QHS   insulin aspart  0-9 Units Subcutaneous TID WC   ipratropium-albuterol  3 mL Inhalation Daily   mouth rinse  15 mL Mouth Rinse q12n4p   oxybutynin  5 mg Oral BID   pantoprazole  40 mg Oral Daily   sertraline  25 mg Oral Daily   acetaminophen **OR** acetaminophen, ondansetron **OR** ondansetron (ZOFRAN) IV, traMADol  Assessment/ Plan:  Ms. Caitlin Hardin is a 55 y.o.  female  with depression, anxiety, sleep apnea, hypertension, and obesity, who was admitted to [facility] on 03/25/2021 for Acute cystitis with hematuria [N30.01] AKI (acute kidney injury) (HCC) [N17.9]   Acute Kidney Injury with hematuria and pyuria likely due to dehydration related to urinary tract infection, with baseline creatinine 0.88 on 08/2018. CT renal stone negative for obstruction, normal appearing kidneys. Enalapril and Metformin held. Creatinine continues to improve with IVF and nutrition. No indication for dialysis at this time. Will continue IVF for through today Will re-assess in am. Patient considered stable for discharge based on rapid recovery.   Lab Results  Component Value Date   CREATININE 2.17 (H) 03/27/2021   CREATININE 3.71 (H) 03/26/2021   CREATININE 4.54 (H) 03/25/2021    Intake/Output Summary (Last 24 hours) at 03/27/2021 1055 Last data filed at 03/27/2021 0521 Gross per 24 hour  Intake --  Output 0 ml  Net 0 ml   2. Diabetes mellitus type II, unspecified noninsulin dependent. Home regimen includes Metformin.  Most recent hemoglobin A1c is 6.5 on 03/25/21. Metformin held.   Glucose stable during this admission  3. Hypertension Home regimen includes Clonidine and enalapril. Enalapril currently held. Remains on clonidine. BP stable for this patient    LOS: 1 Keyna Blizard 9/4/202210:55 AM

## 2021-03-28 LAB — BASIC METABOLIC PANEL
Anion gap: 5 (ref 5–15)
BUN: 25 mg/dL — ABNORMAL HIGH (ref 6–20)
CO2: 25 mmol/L (ref 22–32)
Calcium: 8.8 mg/dL — ABNORMAL LOW (ref 8.9–10.3)
Chloride: 109 mmol/L (ref 98–111)
Creatinine, Ser: 1.45 mg/dL — ABNORMAL HIGH (ref 0.44–1.00)
GFR, Estimated: 42 mL/min — ABNORMAL LOW (ref >60)
Glucose, Bld: 110 mg/dL — ABNORMAL HIGH (ref 70–99)
Potassium: 4.9 mmol/L (ref 3.5–5.1)
Sodium: 139 mmol/L (ref 135–145)

## 2021-03-28 LAB — GLUCOSE, CAPILLARY
Glucose-Capillary: 91 mg/dL (ref 70–99)
Glucose-Capillary: 119 mg/dL — ABNORMAL HIGH (ref 70–99)

## 2021-03-28 LAB — URINE CULTURE: Culture: >=100000 — AB

## 2021-03-28 LAB — CBC
HCT: 31.3 % — ABNORMAL LOW (ref 36.0–46.0)
Hemoglobin: 10.3 g/dL — ABNORMAL LOW (ref 12.0–15.0)
MCH: 31.7 pg (ref 26.0–34.0)
MCHC: 32.9 g/dL (ref 30.0–36.0)
MCV: 96.3 fL (ref 80.0–100.0)
Platelets: 221 10*3/uL (ref 150–400)
RBC: 3.25 MIL/uL — ABNORMAL LOW (ref 3.87–5.11)
RDW: 13.5 % (ref 11.5–15.5)
WBC: 7.9 10*3/uL (ref 4.0–10.5)
nRBC: 0 % (ref 0.0–0.2)

## 2021-03-28 LAB — PHOSPHORUS: Phosphorus: 4.8 mg/dL — ABNORMAL HIGH (ref 2.5–4.6)

## 2021-03-28 LAB — MAGNESIUM: Magnesium: 2 mg/dL (ref 1.7–2.4)

## 2021-03-28 NOTE — Plan of Care (Signed)
  Problem: Education: Goal: Knowledge of General Education information will improve Description: Including pain rating scale, medication(s)/side effects and non-pharmacologic comfort measures Outcome: Adequate for Discharge   Problem: Clinical Measurements: Goal: Diagnostic test results will improve Outcome: Adequate for Discharge   Problem: Activity: Goal: Risk for activity intolerance will decrease Outcome: Adequate for Discharge   Problem: Pain Managment: Goal: General experience of comfort will improve Outcome: Adequate for Discharge

## 2021-03-28 NOTE — Discharge Instructions (Signed)
Caitlin Hardin,  You were in the hospital because of poor kidney function. This improved with IV fluids. Please do not take your enalapril or NSAIDs (for example, your naproxen is discontinued). You also had a urinary infection treated with antibiotics. Please follow-up with your primary care physician

## 2021-03-28 NOTE — Discharge Summary (Signed)
Physician Discharge Summary  Caitlin Hardin LFY:[id number] DOB: Mar 15, 1966 DOA: 03/25/2021  PCP: Galvin Proffer, MD  Admit date: 03/25/2021 Discharge date: 03/28/2021  Admitted From: ALF Disposition: ALF  Recommendations for Outpatient Follow-up:  Follow up with PCP in 1 week Please obtain BMP/CBC in one week Please follow up on the following pending results: None  Home Health: None Equipment/Devices: None  Discharge Condition: Stable CODE STATUS: Full code Diet recommendation: carb modified   Brief/Interim Summary:  Admission HPI written by Lovenia Kim, DO   HPI: Caitlin Hardin is a 55 y.o. female with medical history significant for obesity, depression, anxiety, hypertension, obstructive sleep apnea, presents emergency department for chief concerns of abnormal labs from [facility].   At bedside, she was able to tell me her name, age, location, and current calendar year. She reports poor choices in diet.    She reports that the facility was taking any annual labs of everyone when they noted to the patient that she had an abnormal kidney function.  She denies recent changes to medications in her diet.  She reports that dysuria and dark urine in the last few days.   She reports her blood glucose was 235 on Wednesday night. That scared her and she has been feeling stressed since.    Hospital course:  AKI Creatinine of 4.54 on admission, likely secondary to dehydration. CT abdomen/pelvis without etiology; normal appearing kidneys. Nephrology consulted. Patient treated with IV fluids with consistent improvement in kidney function. Creatinine down to 1.45 on day of discharge. Recommendation to hold NSAIDs and enalapril until PCP follow-up.  UTI E. Coli. Completed treatment with Ceftriaxone 2g IV for three days.  Primary hypertension Patient is on clonidine and enalapril. Enalapril discontinued on discharge secondary to AKI.  Diabetes mellitus, type 2 Continue  metformin  GERD Continue Prilosec  Depression Anxiety Continue Wellbutrin  Generalized pain Continue Tramadol  Gout Resume allopurinol on discharge.  Chronic respiratory failure Stable.  OSA Continued CPAP at night  Vitamin D deficiency Continue OTC supplementation  Morbid obesity Body mass index is 59.09 kg/m.   Discharge Diagnoses:  Principal Problem:   AKI (acute kidney injury) (HCC) Active Problems:   Acute lower UTI   OSA (obstructive sleep apnea)   Essential hypertension   Depression   Diabetes mellitus type 2, noninsulin dependent (HCC)   Obesity, diabetes, and hypertension syndrome (HCC)   Neutrophilic leukocytosis    Discharge Instructions   Allergies as of 03/28/2021   No Known Allergies      Medication List     STOP taking these medications    enalapril 10 MG tablet Commonly known as: VASOTEC   enalapril 5 MG tablet Commonly known as: VASOTEC   naproxen sodium 220 MG tablet Commonly known as: ALEVE   ranitidine 150 MG tablet Commonly known as: ZANTAC       TAKE these medications    acetaminophen 325 MG tablet Commonly known as: TYLENOL Take 2 tablets by mouth every 6 (six) hours as needed.   allopurinol 100 MG tablet Commonly known as: ZYLOPRIM Take 100 mg by mouth 2 (two) times daily.   aspirin 81 MG chewable tablet Chew 81 mg by mouth daily.   azelastine 0.05 % ophthalmic solution Commonly known as: OPTIVAR Place 1 drop into both eyes daily.   azelastine 0.1 % nasal spray Commonly known as: ASTELIN Place 1 spray into both nostrils daily.   buPROPion 100 MG tablet Commonly known as: WELLBUTRIN Take 100  mg by mouth 2 (two) times daily.   calcium carbonate 1250 (500 Ca) MG tablet Commonly known as: OS-CAL - dosed in mg of elemental calcium Take 1 tablet by mouth 2 (two) times daily.   cetirizine 10 MG tablet Commonly known as: ZYRTEC Take 10 mg by mouth daily.   cholecalciferol 25 MCG (1000 UNIT)  tablet Commonly known as: VITAMIN D Take 1 tablet by mouth daily.   cloNIDine 0.2 MG tablet Commonly known as: CATAPRES Take 1 tablet by mouth 3 (three) times daily.   Combivent Respimat 20-100 MCG/ACT Aers respimat Generic drug: Ipratropium-Albuterol Inhale 1 puff into the lungs 4 (four) times daily.   diclofenac Sodium 1 % Gel Commonly known as: VOLTAREN Apply 4 g topically 2 (two) times daily. To both knees and hips.   FeroSul 325 (65 FE) MG tablet Generic drug: ferrous sulfate Take 325 mg by mouth 2 (two) times daily.   fluticasone 50 MCG/ACT nasal spray Commonly known as: FLONASE Place 2 sprays into both nostrils daily.   gabapentin 300 MG capsule Commonly known as: NEURONTIN Take 300 mg by mouth 2 (two) times daily. Take at 1400 and 2000   gabapentin 400 MG capsule Commonly known as: NEURONTIN Take 400 mg by mouth in the morning.   latanoprost 0.005 % ophthalmic solution Commonly known as: XALATAN Place 1 drop into both eyes at bedtime.   loratadine 10 MG tablet Commonly known as: CLARITIN Take 10 mg by mouth daily.   magnesium oxide 400 MG tablet Commonly known as: MAG-OX Take 400 mg by mouth daily.   metFORMIN 500 MG tablet Commonly known as: GLUCOPHAGE Take 1.5 tablets by mouth 2 (two) times daily.   omeprazole 20 MG capsule Commonly known as: PRILOSEC Take 20 mg by mouth daily. Take 30 minutes before breakfast   oxybutynin 5 MG tablet Commonly known as: DITROPAN Take 1 tablet by mouth 2 (two) times daily.   sertraline 50 MG tablet Commonly known as: ZOLOFT Take 25 mg by mouth daily.   traMADol 50 MG tablet Commonly known as: ULTRAM Take 50 mg by mouth every 6 (six) hours as needed.   Zinc Oxide 13 % Crea Apply 1 application topically 2 (two) times daily.        No Known Allergies  Consultations: Nephrology   Procedures/Studies: CT Renal Stone Study  Result Date: 03/25/2021 CLINICAL DATA:  Urinary tract stone,  symptomatic/complicated. Elevated serum creatinine. EXAM: CT ABDOMEN AND PELVIS WITHOUT CONTRAST TECHNIQUE: Multidetector CT imaging of the abdomen and pelvis was performed following the standard protocol without IV contrast. COMPARISON:  None. FINDINGS: Lower chest: The visualized lung bases are clear bilaterally. The visualized heart and pericardium are unremarkable. Hepatobiliary: Mild to moderate hepatic steatosis. No definite intrahepatic mass on this noncontrast examination. No intra or extrahepatic biliary ductal dilation. Gallbladder unremarkable. Pancreas: Unremarkable Spleen: Unremarkable Adrenals/Urinary Tract: Adrenal glands are unremarkable. Kidneys are normal, without renal calculi, focal lesion, or hydronephrosis. Bladder is unremarkable. Stomach/Bowel: Stomach is within normal limits. Appendix appears normal. No evidence of bowel wall thickening, distention, or inflammatory changes. No free intraperitoneal gas or fluid. Vascular/Lymphatic: Mild aortoiliac atherosclerotic calcification. No aortic aneurysm. No pathologic adenopathy within the abdomen and pelvis. Reproductive: Uterus and bilateral adnexa are unremarkable. Other: No abdominal wall hernia.  The rectum is unremarkable. Musculoskeletal: No acute bone abnormality. Degenerative changes are seen within the lumbar spine. No lytic or blastic bone lesion. IMPRESSION: No acute intra-abdominal pathology identified. No definite radiographic explanation for the patient's reported symptoms. Mild to  moderate hepatic steatosis. Aortic Atherosclerosis (ICD10-I70.0). Electronically Signed   By: Helyn Numbers M.D.   On: 03/25/2021 21:56      Subjective: No issues this morning.  Discharge Exam: Vitals:   03/28/21 0414 03/28/21 0804  BP: (!) 145/67 132/65  Pulse: (!) 56 (!) 59  Resp: 20 18  Temp: 97.9 F (36.6 C) 98.9 F (37.2 C)  SpO2: 100% 100%   Vitals:   03/27/21 2120 03/28/21 0024 03/28/21 0414 03/28/21 0804  BP: 129/76  (!) 145/67  132/65  Pulse: (!) 59 60 (!) 56 (!) 59  Resp: 20  20 18   Temp: 98.4 F (36.9 C)  97.9 F (36.6 C) 98.9 F (37.2 C)  TempSrc: Oral  Oral Oral  SpO2: 100% 99% 100% 100%  Weight:      Height:        General: Pt is alert, awake, not in acute distress Cardiovascular: RRR, S1/S2 +, no rubs, no gallops Respiratory: CTA bilaterally, no wheezing, no rhonchi Abdominal: Soft, NT, ND, bowel sounds + Extremities: no edema, no cyanosis    The results of significant diagnostics from this hospitalization (including imaging, microbiology, ancillary and laboratory) are listed below for reference.     Microbiology: Recent Results (from the past 240 hour(s))  Urine Culture     Status: Abnormal   Collection Time: 03/25/21  5:56 PM   Specimen: Urine, Random  Result Value Ref Range Status   Specimen Description   Final    URINE, RANDOM Performed at [facility], [address] [place], [place]    Special Requests   Final    NONE Performed at [facility], [address] [place], [place]    Culture >=100,000 COLONIES/mL ESCHERICHIA COLI (A)  Final   Report Status 03/28/2021 FINAL  Final   Organism ID, Bacteria ESCHERICHIA COLI (A)  Final      Susceptibility   Escherichia coli - MIC*    AMPICILLIN >=32 RESISTANT Resistant     CEFAZOLIN <=4 SENSITIVE Sensitive     CEFEPIME <=0.12 SENSITIVE Sensitive     CEFTRIAXONE <=0.25 SENSITIVE Sensitive     CIPROFLOXACIN <=0.25 SENSITIVE Sensitive     GENTAMICIN <=1 SENSITIVE Sensitive     IMIPENEM <=0.25 SENSITIVE Sensitive     NITROFURANTOIN <=16 SENSITIVE Sensitive     TRIMETH/SULFA <=20 SENSITIVE Sensitive     AMPICILLIN/SULBACTAM 4 SENSITIVE Sensitive     PIP/TAZO <=4 SENSITIVE Sensitive     * >=100,000 COLONIES/mL ESCHERICHIA COLI  Resp Panel by RT-PCR (Flu A&B, Covid) Nasopharyngeal Swab     Status: None   Collection Time: 03/25/21  9:33 PM   Specimen: Nasopharyngeal Swab; Nasopharyngeal(NP) swabs  in vial transport medium  Result Value Ref Range Status   SARS Coronavirus 2 by RT PCR NEGATIVE NEGATIVE Final    Comment: (NOTE) SARS-CoV-2 target nucleic acids are NOT DETECTED.  The SARS-CoV-2 RNA is generally detectable in upper respiratory specimens during the acute phase of infection. The lowest concentration of SARS-CoV-2 viral copies this assay can detect is 138 copies/mL. A negative result does not preclude SARS-Cov-2 infection and should not be used as the sole basis for treatment or other patient management decisions. A negative result may occur with  improper specimen collection/handling, submission of specimen other than nasopharyngeal swab, presence of viral mutation(s) within the areas targeted by this assay, and inadequate number of viral copies(<138 copies/mL). A negative result must be combined with clinical observations, patient history, and epidemiological information. The  expected result is Negative.  Fact Sheet for Patients:  BloggerCourse.comhttps://www.fda.gov/media/152166/download  Fact Sheet for Healthcare Providers:  SeriousBroker.ithttps://www.fda.gov/media/152162/download  This test is no t yet approved or cleared by the [place] FDA and  has been authorized for detection and/or diagnosis of SARS-CoV-2 by FDA under an Emergency Use Authorization (EUA). This EUA will remain  in effect (meaning this test can be used) for the duration of the COVID-19 declaration under Section 564(b)(1) of the Act, 21 U.S.C.section 360bbb-3(b)(1), unless the authorization is terminated  or revoked sooner.       Influenza A by PCR NEGATIVE NEGATIVE Final   Influenza B by PCR NEGATIVE NEGATIVE Final    Comment: (NOTE) The Xpert Xpress SARS-CoV-2/FLU/RSV plus assay is intended as an aid in the diagnosis of influenza from Nasopharyngeal swab specimens and should not be used as a sole basis for treatment. Nasal washings and aspirates are unacceptable for Xpert Xpress  SARS-CoV-2/FLU/RSV testing.  Fact Sheet for Patients: BloggerCourse.comhttps://www.fda.gov/media/152166/download  Fact Sheet for Healthcare Providers: SeriousBroker.ithttps://www.fda.gov/media/152162/download  This test is not yet approved or cleared by the [place] FDA and has been authorized for detection and/or diagnosis of SARS-CoV-2 by FDA under an Emergency Use Authorization (EUA). This EUA will remain in effect (meaning this test can be used) for the duration of the COVID-19 declaration under Section 564(b)(1) of the Act, 21 U.S.C. section 360bbb-3(b)(1), unless the authorization is terminated or revoked.  Performed at [facility], [address] [place], [place] [id number]      Labs: BNP (last 3 results) No results for input(s): BNP in the last 8760 hours. Basic Metabolic Panel: Recent Labs  Lab 03/25/21 1753 03/26/21 0504 03/27/21 0448 03/28/21 0513  NA 133* 136 139 139  K 4.8 4.6 5.0 4.9  CL 98 102 108 109  CO2 26 24 24 25   GLUCOSE 162* 117* 109* 110*  BUN 55* 53* 38* 25*  CREATININE 4.54* 3.71* 2.17* 1.45*  CALCIUM 8.8* 8.3* 9.0 8.8*  MG  --   --  2.3 2.0  PHOS  --   --  4.8* 4.8*   Liver Function Tests: No results for input(s): AST, ALT, ALKPHOS, BILITOT, PROT, ALBUMIN in the last 168 hours. No results for input(s): LIPASE, AMYLASE in the last 168 hours. No results for input(s): AMMONIA in the last 168 hours. CBC: Recent Labs  Lab 03/25/21 1753 03/26/21 0504 03/27/21 0448 03/28/21 0513  WBC 13.3* 11.7* 7.3 7.9  HGB 11.1* 10.4* 10.3* 10.3*  HCT 32.8* 30.9* 30.6* 31.3*  MCV 96.5 95.7 96.5 96.3  PLT 206 205 222 221   Cardiac Enzymes: No results for input(s): CKTOTAL, CKMB, CKMBINDEX, TROPONINI in the last 168 hours. BNP: Invalid input(s): POCBNP CBG: Recent Labs  Lab 03/26/21 2040 03/27/21 0743 03/27/21 1133 03/27/21 1638 03/27/21 2123  GLUCAP 112* 102* 102* 93 133*   D-Dimer No results for input(s): DDIMER in the last 72 hours. Hgb A1c Recent Labs     03/25/21 1753  HGBA1C 6.5*   Lipid Profile No results for input(s): CHOL, HDL, LDLCALC, TRIG, CHOLHDL, LDLDIRECT in the last 72 hours. Thyroid function studies No results for input(s): TSH, T4TOTAL, T3FREE, THYROIDAB in the last 72 hours.  Invalid input(s): FREET3 Anemia work up Recent Labs    03/26/21 0933  VITAMINB12 478  FOLATE 16.2  TIBC 209*  IRON 33   Urinalysis    Component Value Date/Time   COLORURINE YELLOW (A) 03/25/2021 1756   APPEARANCEUR CLOUDY (A) 03/25/2021 1756   LABSPEC 1.015 03/25/2021  1756   PHURINE 5.5 03/25/2021 1756   GLUCOSEU NEGATIVE 03/25/2021 1756   HGBUR SMALL (A) 03/25/2021 1756   BILIRUBINUR NEGATIVE 03/25/2021 1756   KETONESUR NEGATIVE 03/25/2021 1756   PROTEINUR 30 (A) 03/25/2021 1756   NITRITE POSITIVE (A) 03/25/2021 1756   LEUKOCYTESUR LARGE (A) 03/25/2021 1756   Sepsis Labs Invalid input(s): PROCALCITONIN,  WBC,  LACTICIDVEN Microbiology Recent Results (from the past 240 hour(s))  Urine Culture     Status: Abnormal   Collection Time: 03/25/21  5:56 PM   Specimen: Urine, Random  Result Value Ref Range Status   Specimen Description   Final    URINE, RANDOM Performed at [facility], [address], [place], [place] [address]    Special Requests   Final    NONE Performed at [facility], [address] [place], [place] [address]    Culture >=100,000 COLONIES/mL ESCHERICHIA COLI (A)  Final   Report Status 03/28/2021 FINAL  Final   Organism ID, Bacteria ESCHERICHIA COLI (A)  Final      Susceptibility   Escherichia coli - MIC*    AMPICILLIN >=32 RESISTANT Resistant     CEFAZOLIN <=4 SENSITIVE Sensitive     CEFEPIME <=0.12 SENSITIVE Sensitive     CEFTRIAXONE <=0.25 SENSITIVE Sensitive     CIPROFLOXACIN <=0.25 SENSITIVE Sensitive     GENTAMICIN <=1 SENSITIVE Sensitive     IMIPENEM <=0.25 SENSITIVE Sensitive     NITROFURANTOIN <=16 SENSITIVE Sensitive     TRIMETH/SULFA <=20 SENSITIVE Sensitive      AMPICILLIN/SULBACTAM 4 SENSITIVE Sensitive     PIP/TAZO <=4 SENSITIVE Sensitive     * >=100,000 COLONIES/mL ESCHERICHIA COLI  Resp Panel by RT-PCR (Flu A&B, Covid) Nasopharyngeal Swab     Status: None   Collection Time: 03/25/21  9:33 PM   Specimen: Nasopharyngeal Swab; Nasopharyngeal(NP) swabs in vial transport medium  Result Value Ref Range Status   SARS Coronavirus 2 by RT PCR NEGATIVE NEGATIVE Final    Comment: (NOTE) SARS-CoV-2 target nucleic acids are NOT DETECTED.  The SARS-CoV-2 RNA is generally detectable in upper respiratory specimens during the acute phase of infection. The lowest concentration of SARS-CoV-2 viral copies this assay can detect is 138 copies/mL. A negative result does not preclude SARS-Cov-2 infection and should not be used as the sole basis for treatment or other patient management decisions. A negative result may occur with  improper specimen collection/handling, submission of specimen other than nasopharyngeal swab, presence of viral mutation(s) within the areas targeted by this assay, and inadequate number of viral copies(<138 copies/mL). A negative result must be combined with clinical observations, patient history, and epidemiological information. The expected result is Negative.  Fact Sheet for Patients:  BloggerCourse.com  Fact Sheet for Healthcare Providers:  SeriousBroker.it  This test is no t yet approved or cleared by the [place] FDA and  has been authorized for detection and/or diagnosis of SARS-CoV-2 by FDA under an Emergency Use Authorization (EUA). This EUA will remain  in effect (meaning this test can be used) for the duration of the COVID-19 declaration under Section 564(b)(1) of the Act, 21 U.S.C.section 360bbb-3(b)(1), unless the authorization is terminated  or revoked sooner.       Influenza A by PCR NEGATIVE NEGATIVE Final   Influenza B by PCR NEGATIVE NEGATIVE Final     Comment: (NOTE) The Xpert Xpress SARS-CoV-2/FLU/RSV plus assay is intended as an aid in the diagnosis of influenza from Nasopharyngeal swab specimens and should not be used as  a sole basis for treatment. Nasal washings and aspirates are unacceptable for Xpert Xpress SARS-CoV-2/FLU/RSV testing.  Fact Sheet for Patients: BloggerCourse.com  Fact Sheet for Healthcare Providers: SeriousBroker.it  This test is not yet approved or cleared by the [place] FDA and has been authorized for detection and/or diagnosis of SARS-CoV-2 by FDA under an Emergency Use Authorization (EUA). This EUA will remain in effect (meaning this test can be used) for the duration of the COVID-19 declaration under Section 564(b)(1) of the Act, 21 U.S.C. section 360bbb-3(b)(1), unless the authorization is terminated or revoked.  Performed at [facility] [place], [address], [place], [place] [address]      Time coordinating discharge: 35 minutes  SIGNED:   Jacquelin Hawking, MD [facility] 03/28/2021, 8:11 AM

## 2021-03-28 NOTE — TOC Transition Note (Signed)
Transition of Care [facility]) - CM/SW Discharge Note   Patient Details  Name: Caitlin Hardin MRN: [id number] Date of Birth: 1965-07-26  [facility]) CM/SW Contact:  Hetty Ely, RN Phone Number: 03/28/2021, 1:49 PM   Clinical Narrative:  Patient to discharge back to [facility], Daryel Gerald Resident Director notified, says she will inform staff that patient will be returning to facility today. Ann Pinnix, [phone] relative will provide transportation and will be here at 2:30pm in front of the [facility] entrance. Attending and Nurse aware.    Final next level of care: Assisted Living Barriers to Discharge: Barriers Resolved   Patient Goals and CMS Choice Patient states their goals for this hospitalization and ongoing recovery are:: To return to [facility] CMS Medicare.gov Compare Post Acute Care list provided to:: Patient Choice offered to / list presented to : Patient  Discharge Placement                Patient to be transferred to facility by: Family Name of family member notified: Ann Pinnix Patient and family notified of of transfer: 03/28/21  Discharge Plan and Services                                     Social Determinants of Health (SDOH) Interventions     Readmission Risk Interventions Readmission Risk Prevention Plan 03/27/2021  Post Dischage Appt Complete  Medication Screening Complete  Transportation Screening Complete  Some recent data might be hidden

## 2021-03-28 NOTE — Progress Notes (Signed)
Pt discharged tin stable condition. Dewayne Hatch (cousin and POA) to transport pt to facility. Discharge instructions discussed with pt and then placed in packet to give to facility. Pt verbalized understanding. Discharged from unit via wheelchair.

## 2021-03-28 NOTE — TOC Progression Note (Signed)
Transition of Care [facility]) - Progression Note    Patient Details  Name: Caitlin Hardin MRN: [id number] Date of Birth: 1966-03-27  [facility]) CM/SW Contact  Hetty Ely, RN Phone Number: 03/28/2021, 12:44 PM  Clinical Narrative:  Sherron Monday with [facility] Resident Director, Daryel Gerald, [phone] about discharge plan for today or tomorrow. Ms. Nolen Mu says she will prefer discharge tomorrow 03/29/21, she will be able to transport to please call when ready. Attending and Nurse notified.     Expected Discharge Plan: Assisted Living Barriers to Discharge: Continued Medical Work up  Expected Discharge Plan and Services Expected Discharge Plan: Assisted Living       Living arrangements for the past 2 months: Assisted Living Facility                                       Social Determinants of Health (SDOH) Interventions    Readmission Risk Interventions Readmission Risk Prevention Plan 03/27/2021  Post Dischage Appt Complete  Medication Screening Complete  Transportation Screening Complete  Some recent data might be hidden

## 2021-03-28 NOTE — Progress Notes (Signed)
Order Requisition for prayer. Met with patient and provided presence and prayer as requested.

## 2021-03-28 NOTE — Progress Notes (Signed)
[facility]  ROUNDING NOTE   Subjective:   Caitlin Hardin is a 55 y.o. female admitted for Acute cystitis with hematuria [N30.01] AKI (acute kidney injury) (HCC) [N17.9]  Patient feeling quite well today. Creatinine down to 1.5. Patient in good spirits today.   Objective:  Vital signs in last 24 hours:  Temp:  [97.9 F (36.6 C)-98.9 F (37.2 C)] 98.9 F (37.2 C) (09/05 0804) Pulse Rate:  [56-62] 59 (09/05 0804) Resp:  [18-20] 18 (09/05 0804) BP: (129-153)/(65-76) 132/65 (09/05 0804) SpO2:  [99 %-100 %] 100 % (09/05 0804)  Weight change:  Filed Weights   03/25/21 1740 03/26/21 0043  Weight: 129.7 kg 132.7 kg    Intake/Output: I/O last 3 completed shifts: In: 1634 [I.V.:1434; IV Piggyback:200] Out: 0    Intake/Output this shift:  Total I/O In: 360 [P.O.:360] Out: -   Physical Exam: General: NAD  Head: Normocephalic, atraumatic. Moist oral mucosal membranes  Eyes: Anicteric  Lungs:  Clear to auscultation, normal effort, O2 2L  Heart: Regular rate and rhythm  Abdomen:  Soft, nontender  Extremities: no peripheral edema.  Neurologic: Nonfocal, moving all four extremities  Skin: No lesions       Basic Metabolic Panel: Recent Labs  Lab 03/25/21 1753 03/26/21 0504 03/27/21 0448 03/28/21 0513  NA 133* 136 139 139  K 4.8 4.6 5.0 4.9  CL 98 102 108 109  CO2 26 24 24 25   GLUCOSE 162* 117* 109* 110*  BUN 55* 53* 38* 25*  CREATININE 4.54* 3.71* 2.17* 1.45*  CALCIUM 8.8* 8.3* 9.0 8.8*  MG  --   --  2.3 2.0  PHOS  --   --  4.8* 4.8*     Liver Function Tests: No results for input(s): AST, ALT, ALKPHOS, BILITOT, PROT, ALBUMIN in the last 168 hours. No results for input(s): LIPASE, AMYLASE in the last 168 hours. No results for input(s): AMMONIA in the last 168 hours.  CBC: Recent Labs  Lab 03/25/21 1753 03/26/21 0504 03/27/21 0448 03/28/21 0513  WBC 13.3* 11.7* 7.3 7.9  HGB 11.1* 10.4* 10.3* 10.3*  HCT 32.8* 30.9* 30.6* 31.3*  MCV  96.5 95.7 96.5 96.3  PLT 206 205 222 221     Cardiac Enzymes: No results for input(s): CKTOTAL, CKMB, CKMBINDEX, TROPONINI in the last 168 hours.  BNP: Invalid input(s): POCBNP  CBG: Recent Labs  Lab 03/27/21 1133 03/27/21 1638 03/27/21 2123 03/28/21 0806 03/28/21 1144  GLUCAP 102* 93 133* 91 119*     Microbiology: Results for orders placed or performed during the hospital encounter of 03/25/21  Urine Culture     Status: Abnormal   Collection Time: 03/25/21  5:56 PM   Specimen: Urine, Random  Result Value Ref Range Status   Specimen Description   Final    URINE, RANDOM Performed at [facility], [address] [place], [place]    Special Requests   Final    NONE Performed at [facility], [address] [place], [place]    Culture >=100,000 COLONIES/mL ESCHERICHIA COLI (A)  Final   Report Status 03/28/2021 FINAL  Final   Organism ID, Bacteria ESCHERICHIA COLI (A)  Final      Susceptibility   Escherichia coli - MIC*    AMPICILLIN >=32 RESISTANT Resistant     CEFAZOLIN <=4 SENSITIVE Sensitive     CEFEPIME <=0.12 SENSITIVE Sensitive     CEFTRIAXONE <=0.25 SENSITIVE Sensitive     CIPROFLOXACIN <=0.25 SENSITIVE Sensitive  GENTAMICIN <=1 SENSITIVE Sensitive     IMIPENEM <=0.25 SENSITIVE Sensitive     NITROFURANTOIN <=16 SENSITIVE Sensitive     TRIMETH/SULFA <=20 SENSITIVE Sensitive     AMPICILLIN/SULBACTAM 4 SENSITIVE Sensitive     PIP/TAZO <=4 SENSITIVE Sensitive     * >=100,000 COLONIES/mL ESCHERICHIA COLI  Resp Panel by RT-PCR (Flu A&B, Covid) Nasopharyngeal Swab     Status: None   Collection Time: 03/25/21  9:33 PM   Specimen: Nasopharyngeal Swab; Nasopharyngeal(NP) swabs in vial transport medium  Result Value Ref Range Status   SARS Coronavirus 2 by RT PCR NEGATIVE NEGATIVE Final    Comment: (NOTE) SARS-CoV-2 target nucleic acids are NOT DETECTED.  The SARS-CoV-2 RNA is generally detectable in upper  respiratory specimens during the acute phase of infection. The lowest concentration of SARS-CoV-2 viral copies this assay can detect is 138 copies/mL. A negative result does not preclude SARS-Cov-2 infection and should not be used as the sole basis for treatment or other patient management decisions. A negative result may occur with  improper specimen collection/handling, submission of specimen other than nasopharyngeal swab, presence of viral mutation(s) within the areas targeted by this assay, and inadequate number of viral copies(<138 copies/mL). A negative result must be combined with clinical observations, patient history, and epidemiological information. The expected result is Negative.  Fact Sheet for Patients:  BloggerCourse.com  Fact Sheet for Healthcare Providers:  SeriousBroker.it  This test is no t yet approved or cleared by the [place] FDA and  has been authorized for detection and/or diagnosis of SARS-CoV-2 by FDA under an Emergency Use Authorization (EUA). This EUA will remain  in effect (meaning this test can be used) for the duration of the COVID-19 declaration under Section 564(b)(1) of the Act, 21 U.S.C.section 360bbb-3(b)(1), unless the authorization is terminated  or revoked sooner.       Influenza A by PCR NEGATIVE NEGATIVE Final   Influenza B by PCR NEGATIVE NEGATIVE Final    Comment: (NOTE) The Xpert Xpress SARS-CoV-2/FLU/RSV plus assay is intended as an aid in the diagnosis of influenza from Nasopharyngeal swab specimens and should not be used as a sole basis for treatment. Nasal washings and aspirates are unacceptable for Xpert Xpress SARS-CoV-2/FLU/RSV testing.  Fact Sheet for Patients: BloggerCourse.com  Fact Sheet for Healthcare Providers: SeriousBroker.it  This test is not yet approved or cleared by the [place] FDA and has been  authorized for detection and/or diagnosis of SARS-CoV-2 by FDA under an Emergency Use Authorization (EUA). This EUA will remain in effect (meaning this test can be used) for the duration of the COVID-19 declaration under Section 564(b)(1) of the Act, 21 U.S.C. section 360bbb-3(b)(1), unless the authorization is terminated or revoked.  Performed at [facility], [address] [place], [place] [address]     Coagulation Studies: No results for input(s): LABPROT, INR in the last 72 hours.  Urinalysis: Recent Labs    03/25/21 1756  COLORURINE YELLOW*  LABSPEC 1.015  PHURINE 5.5  GLUCOSEU NEGATIVE  HGBUR SMALL*  BILIRUBINUR NEGATIVE  KETONESUR NEGATIVE  PROTEINUR 30*  NITRITE POSITIVE*  LEUKOCYTESUR LARGE*       Imaging: No results found.   Medications:    cefTRIAXone (ROCEPHIN)  IV Stopped (03/27/21 2136)    buPROPion  100 mg Oral BID   chlorhexidine  15 mL Mouth Rinse BID   cloNIDine  0.2 mg Oral TID   diclofenac Sodium  4 g Topical BID   ferrous sulfate  325 mg Oral  BID WC   fluticasone  2 spray Each Nare Daily   gabapentin  400 mg Oral q AM   heparin  5,000 Units Subcutaneous Q8H   insulin aspart  0-5 Units Subcutaneous QHS   insulin aspart  0-9 Units Subcutaneous TID WC   ipratropium-albuterol  3 mL Inhalation Daily   mouth rinse  15 mL Mouth Rinse q12n4p   oxybutynin  5 mg Oral BID   pantoprazole  40 mg Oral Daily   sertraline  25 mg Oral Daily   acetaminophen **OR** acetaminophen, ondansetron **OR** ondansetron (ZOFRAN) IV, traMADol  Assessment/ Plan:  Ms. Panagiota Perfetti is a 55 y.o.  female  with depression, anxiety, sleep apnea, hypertension, and obesity, who was admitted to [facility] on 03/25/2021 for Acute cystitis with hematuria [N30.01] AKI (acute kidney injury) (HCC) [N17.9]   Acute Kidney Injury with hematuria and pyuria likely due to dehydration related to urinary tract infection, with baseline creatinine 0.88 on 08/2018. CT renal stone  negative for obstruction, normal appearing kidneys. Enalapril and Metformin held.  -Renal function improved.  Hold off on adding back enalapril at this time.  Also advised patient to avoid NSAIDs post discharge for now.   Lab Results  Component Value Date   CREATININE 1.45 (H) 03/28/2021   CREATININE 2.17 (H) 03/27/2021   CREATININE 3.71 (H) 03/26/2021    Intake/Output Summary (Last 24 hours) at 03/28/2021 1411 Last data filed at 03/28/2021 1014 Gross per 24 hour  Intake 1993.96 ml  Output 0 ml  Net 1993.96 ml    2. Diabetes mellitus type II, unspecified noninsulin dependent. Home regimen includes Metformin. Most recent hemoglobin A1c is 6.5 on 03/25/21. Metformin held.   Okay to restart upon discharge but defer to primary team.  3. Hypertension Home regimen includes Clonidine and enalapril.  Continue to hold enalapril.  Okay to maintain clonidine.  Consider adding back enalapril once renal function has stabilized as an outpatient.    LOS: 2 Shauntee Karp 9/5/20222:11 PM

## 2021-03-29 NOTE — TOC Progression Note (Signed)
Transition of Care [facility]) - Progression Note    Patient Details  Name: Latrisha Coiro MRN: [id number] Date of Birth: 09/19/1965  Transition of Care [facility]) CM/SW Contact  Hetty Ely, RN Phone Number: 03/29/2021, 11:47 AM  Clinical Narrative:  Discharge Summary and FL2 faxed to [facility], [phone].    Expected Discharge Plan: Assisted Living Barriers to Discharge: Barriers Resolved  Expected Discharge Plan and Services Expected Discharge Plan: Assisted Living       Living arrangements for the past 2 months: Assisted Living Facility Expected Discharge Date: 03/28/21                                     Social Determinants of Health (SDOH) Interventions    Readmission Risk Interventions Readmission Risk Prevention Plan 03/27/2021  Post Dischage Appt Complete  Medication Screening Complete  Transportation Screening Complete  Some recent data might be hidden

## 2021-03-29 NOTE — NC FL2 (Signed)
[place] MEDICAID FL2 LEVEL OF CARE SCREENING TOOL     IDENTIFICATION  Patient Name: Caitlin Hardin Birthdate: 1965-08-01 Sex: female Admission Date (Current Location): 03/25/2021  [facility] and [place] Number:  Chiropodist and Address:  [facility], [address] [place], [place] [address]      Provider Number: [id number]  Attending Physician Name and Address:  No att. providers found  Relative Name and Phone Number:  Sibyl Parr [phone]    Current Level of Care: Hospital Recommended Level of Care: Assisted Living Facility Prior Approval Number:    Date Approved/Denied:   PASRR Number:    Discharge Plan: Other (Comment) ([facility])    Current Diagnoses: Patient Active Problem List   Diagnosis Date Noted   AKI (acute kidney injury) (HCC) 03/25/2021   Acute lower UTI 03/25/2021   OSA (obstructive sleep apnea) 03/25/2021   Essential hypertension 03/25/2021   Depression 03/25/2021   Diabetes mellitus type 2, noninsulin dependent (HCC) 03/25/2021   Obesity, diabetes, and hypertension syndrome (HCC) 03/25/2021   Neutrophilic leukocytosis 03/25/2021    Orientation RESPIRATION BLADDER Height & Weight     Self, Time, Situation  Other (Comment) (Continue C-Pap at night) Continent Weight: 132.7 kg Height:  4\' 11"  (149.9 cm)  BEHAVIORAL SYMPTOMS/MOOD NEUROLOGICAL BOWEL NUTRITION STATUS      Continent Diet  AMBULATORY STATUS COMMUNICATION OF NEEDS Skin   Supervision Verbally Normal                       Personal Care Assistance Level of Assistance  Bathing, Feeding, Dressing Bathing Assistance: Independent Feeding assistance: Independent Dressing Assistance: Independent     Functional Limitations Info  Sight, Hearing, Speech Sight Info: Adequate Hearing Info: Adequate Speech Info: Adequate    SPECIAL CARE FACTORS FREQUENCY                       Contractures Contractures Info: Not present     Additional Factors Info  Code Status, Allergies Code Status Info: FULL Allergies Info: No known medication allergies           Current Medications (03/29/2021):  This is the current hospital active medication list No current facility-administered medications for this encounter.   Current Outpatient Medications  Medication Sig Dispense Refill   allopurinol (ZYLOPRIM) 100 MG tablet Take 100 mg by mouth 2 (two) times daily.     aspirin 81 MG chewable tablet Chew 81 mg by mouth daily.     buPROPion (WELLBUTRIN) 100 MG tablet Take 100 mg by mouth 2 (two) times daily.     calcium carbonate (OS-CAL - DOSED IN MG OF ELEMENTAL CALCIUM) 1250 (500 Ca) MG tablet Take 1 tablet by mouth 2 (two) times daily.     cholecalciferol (VITAMIN D) 25 MCG (1000 UT) tablet Take 1 tablet by mouth daily.     cloNIDine (CATAPRES) 0.2 MG tablet Take 1 tablet by mouth 3 (three) times daily.     COMBIVENT RESPIMAT 20-100 MCG/ACT AERS respimat Inhale 1 puff into the lungs 4 (four) times daily.     diclofenac Sodium (VOLTAREN) 1 % GEL Apply 4 g topically 2 (two) times daily. To both knees and hips.     FEROSUL 325 (65 Fe) MG tablet Take 325 mg by mouth 2 (two) times daily.     fluticasone (FLONASE) 50 MCG/ACT nasal spray Place 2 sprays into both nostrils daily.     gabapentin (NEURONTIN) 300 MG  capsule Take 300 mg by mouth 2 (two) times daily. Take at 1400 and 2000     gabapentin (NEURONTIN) 400 MG capsule Take 400 mg by mouth in the morning.     latanoprost (XALATAN) 0.005 % ophthalmic solution Place 1 drop into both eyes at bedtime.     loratadine (CLARITIN) 10 MG tablet Take 10 mg by mouth daily.     magnesium oxide (MAG-OX) 400 MG tablet Take 400 mg by mouth daily.     metFORMIN (GLUCOPHAGE) 500 MG tablet Take 1.5 tablets by mouth 2 (two) times daily.     omeprazole (PRILOSEC) 20 MG capsule Take 20 mg by mouth daily. Take 30 minutes before breakfast     oxybutynin (DITROPAN) 5 MG tablet Take 1 tablet by mouth 2  (two) times daily.     sertraline (ZOLOFT) 50 MG tablet Take 25 mg by mouth daily.     Zinc Oxide 13 % CREA Apply 1 application topically 2 (two) times daily.     acetaminophen (TYLENOL) 325 MG tablet Take 2 tablets by mouth every 6 (six) hours as needed.     azelastine (ASTELIN) 0.1 % nasal spray Place 1 spray into both nostrils daily.     azelastine (OPTIVAR) 0.05 % ophthalmic solution Place 1 drop into both eyes daily.     cetirizine (ZYRTEC) 10 MG tablet Take 10 mg by mouth daily.     traMADol (ULTRAM) 50 MG tablet Take 50 mg by mouth every 6 (six) hours as needed.       Discharge Medications: Please see discharge summary for a list of discharge medications.  Relevant Imaging Results:  Relevant Lab Results:   Additional Information SS# [id number]  Hetty Ely, RN

## 2021-10-19 ENCOUNTER — Other Ambulatory Visit: Payer: Self-pay | Admitting: Adult Health

## 2021-10-19 DIAGNOSIS — Z1231 Encounter for screening mammogram for malignant neoplasm of breast: Secondary | ICD-10-CM

## 2021-11-25 ENCOUNTER — Ambulatory Visit
Admission: RE | Admit: 2021-11-25 | Discharge: 2021-11-25 | Disposition: A | Discharge status: Home / Self Care | Payer: Medicare Other | Source: Ambulatory Visit | Attending: Adult Health | Admitting: Adult Health

## 2021-11-25 DIAGNOSIS — Z1231 Encounter for screening mammogram for malignant neoplasm of breast: Secondary | ICD-10-CM | POA: Insufficient documentation

## 2021-12-29 ENCOUNTER — Encounter: Payer: Self-pay | Admitting: Ophthalmology

## 2022-01-02 NOTE — Discharge Instructions (Signed)

## 2022-01-04 ENCOUNTER — Ambulatory Visit: Payer: Medicare Other | Admitting: Anesthesiology

## 2022-01-04 ENCOUNTER — Other Ambulatory Visit: Payer: Self-pay

## 2022-01-04 ENCOUNTER — Ambulatory Visit
Admission: RE | Admit: 2022-01-04 | Discharge: 2022-01-04 | Disposition: A | Discharge status: Home / Self Care | Payer: Medicare Other | Attending: Ophthalmology | Admitting: Ophthalmology

## 2022-01-04 ENCOUNTER — Encounter: Payer: Self-pay | Admitting: Ophthalmology

## 2022-01-04 ENCOUNTER — Encounter
Admission: RE | Disposition: A | Discharge status: Home / Self Care | Payer: Self-pay | Source: Home / Self Care | Attending: Ophthalmology

## 2022-01-04 DIAGNOSIS — H2511 Age-related nuclear cataract, right eye: Secondary | ICD-10-CM | POA: Insufficient documentation

## 2022-01-04 DIAGNOSIS — E1136 Type 2 diabetes mellitus with diabetic cataract: Secondary | ICD-10-CM | POA: Diagnosis present

## 2022-01-04 DIAGNOSIS — Z7984 Long term (current) use of oral hypoglycemic drugs: Secondary | ICD-10-CM | POA: Diagnosis not present

## 2022-01-04 DIAGNOSIS — J449 Chronic obstructive pulmonary disease, unspecified: Secondary | ICD-10-CM | POA: Diagnosis not present

## 2022-01-04 DIAGNOSIS — M1990 Unspecified osteoarthritis, unspecified site: Secondary | ICD-10-CM | POA: Insufficient documentation

## 2022-01-04 DIAGNOSIS — Z6841 Body Mass Index (BMI) 40.0 and over, adult: Secondary | ICD-10-CM | POA: Insufficient documentation

## 2022-01-04 DIAGNOSIS — E669 Obesity, unspecified: Secondary | ICD-10-CM

## 2022-01-04 DIAGNOSIS — I10 Essential (primary) hypertension: Secondary | ICD-10-CM | POA: Insufficient documentation

## 2022-01-04 DIAGNOSIS — G4730 Sleep apnea, unspecified: Secondary | ICD-10-CM | POA: Insufficient documentation

## 2022-01-04 DIAGNOSIS — K219 Gastro-esophageal reflux disease without esophagitis: Secondary | ICD-10-CM | POA: Diagnosis not present

## 2022-01-04 DIAGNOSIS — F32A Depression, unspecified: Secondary | ICD-10-CM | POA: Insufficient documentation

## 2022-01-04 DIAGNOSIS — Z8673 Personal history of transient ischemic attack (TIA), and cerebral infarction without residual deficits: Secondary | ICD-10-CM | POA: Insufficient documentation

## 2022-01-04 DIAGNOSIS — E1142 Type 2 diabetes mellitus with diabetic polyneuropathy: Secondary | ICD-10-CM | POA: Diagnosis not present

## 2022-01-04 HISTORY — DX: Polyneuropathy, unspecified: G62.9

## 2022-01-04 HISTORY — DX: Cerebral infarction, unspecified: I63.9

## 2022-01-04 HISTORY — DX: Type 2 diabetes mellitus without complications: E11.9

## 2022-01-04 HISTORY — DX: Overactive bladder: N32.81

## 2022-01-04 HISTORY — DX: Gastro-esophageal reflux disease without esophagitis: K21.9

## 2022-01-04 HISTORY — PX: CATARACT EXTRACTION W/PHACO: SHX586

## 2022-01-04 LAB — GLUCOSE, CAPILLARY
Glucose-Capillary: 108 mg/dL — ABNORMAL HIGH (ref 70–99)
Glucose-Capillary: 107 mg/dL — ABNORMAL HIGH (ref 70–99)

## 2022-01-04 SURGERY — PHACOEMULSIFICATION, CATARACT, WITH IOL INSERTION
Anesthesia: Monitor Anesthesia Care | Site: Eye | Laterality: Right

## 2022-01-04 MED ORDER — SIGHTPATH DOSE#1 NA HYALUR & NA CHOND-NA HYALUR IO KIT
PACK | INTRAOCULAR | Status: DC | PRN
  Administered 2022-01-04: 1 via OPHTHALMIC

## 2022-01-04 MED ORDER — BRIMONIDINE TARTRATE-TIMOLOL 0.2-0.5 % OP SOLN
OPHTHALMIC | Status: DC | PRN
  Administered 2022-01-04: 1 [drp] via OPHTHALMIC

## 2022-01-04 MED ORDER — SIGHTPATH DOSE#1 BSS IO SOLN
INTRAOCULAR | Status: DC | PRN
  Administered 2022-01-04: 44 mL via OPHTHALMIC

## 2022-01-04 MED ORDER — ARMC OPHTHALMIC DILATING DROPS
1.0000 "application " | OPHTHALMIC | Status: DC | PRN
  Administered 2022-01-04 (×3): 1 via OPHTHALMIC

## 2022-01-04 MED ORDER — TETRACAINE HCL 0.5 % OP SOLN
1.0000 [drp] | OPHTHALMIC | Status: DC | PRN
  Administered 2022-01-04 (×3): 1 [drp] via OPHTHALMIC

## 2022-01-04 MED ORDER — MIDAZOLAM HCL 2 MG/2ML IJ SOLN
INTRAMUSCULAR | Status: DC | PRN
  Administered 2022-01-04: 2 mg via INTRAVENOUS

## 2022-01-04 MED ORDER — SIGHTPATH DOSE#1 BSS IO SOLN
INTRAOCULAR | Status: DC | PRN
  Administered 2022-01-04: 15 mL

## 2022-01-04 MED ORDER — CEFUROXIME OPHTHALMIC INJECTION 1 MG/0.1 ML
INJECTION | OPHTHALMIC | Status: DC | PRN
  Administered 2022-01-04: 1 mg via OPHTHALMIC

## 2022-01-04 MED ORDER — LACTATED RINGERS IV SOLN: INTRAVENOUS | Status: DC

## 2022-01-04 MED ORDER — FENTANYL CITRATE (PF) 100 MCG/2ML IJ SOLN
INTRAMUSCULAR | Status: DC | PRN
  Administered 2022-01-04: 50 ug via INTRAVENOUS

## 2022-01-04 MED ORDER — SIGHTPATH DOSE#1 BSS IO SOLN
INTRAOCULAR | Status: DC | PRN
  Administered 2022-01-04: 1 mL via INTRAMUSCULAR

## 2022-01-04 SURGICAL SUPPLY — 11 items
CATARACT SUITE SIGHTPATH (MISCELLANEOUS) ×2 IMPLANT
FEE CATARACT SUITE SIGHTPATH (MISCELLANEOUS) ×1 IMPLANT
GLOVE SRG 8 PF TXTR STRL LF DI (GLOVE) ×1 IMPLANT
GLOVE SURG ENC TEXT LTX SZ7.5 (GLOVE) ×2 IMPLANT
GLOVE SURG UNDER POLY LF SZ8 (GLOVE) ×2
LENS IOL TECNIS EYHANCE 12.0 (Intraocular Lens) ×1 IMPLANT
NDL FILTER BLUNT 18X1 1/2 (NEEDLE) ×1 IMPLANT
NEEDLE FILTER BLUNT 18X 1/2SAF (NEEDLE) ×1
NEEDLE FILTER BLUNT 18X1 1/2 (NEEDLE) ×1 IMPLANT
SYR 3ML LL SCALE MARK (SYRINGE) ×2 IMPLANT
WATER STERILE IRR 250ML POUR (IV SOLUTION) ×2 IMPLANT

## 2022-01-04 NOTE — Anesthesia Preprocedure Evaluation (Addendum)
Anesthesia Evaluation  Patient identified by MRN, date of birth, ID band Patient awake    Reviewed: Allergy & Precautions, NPO status , Patient's Chart, lab work & pertinent test results  Airway Mallampati: III  TM Distance: >3 FB Neck ROM: Full    Dental no notable dental hx.    Pulmonary asthma , sleep apnea and Oxygen sleep apnea , COPD,    Pulmonary exam normal        Cardiovascular hypertension, Normal cardiovascular exam     Neuro/Psych Depression  Neuromuscular disease (Peripheral neuropathy) CVA    GI/Hepatic GERD  Controlled,  Endo/Other  diabetes, Type 2Morbid obesity (BMI 53)  Renal/GU      Musculoskeletal  (+) Arthritis ,   Abdominal (+) + obese,   Peds  Hematology negative hematology ROS (+)   Anesthesia Other Findings   Reproductive/Obstetrics                            Anesthesia Physical Anesthesia Plan  ASA: 3  Anesthesia Plan: MAC   Post-op Pain Management: Minimal or no pain anticipated   Induction: Intravenous  PONV Risk Score and Plan: 2 and TIVA, Midazolam and Treatment may vary due to age or medical condition  Airway Management Planned: Natural Airway and Nasal Cannula  Additional Equipment:   Intra-op Plan:   Post-operative Plan:   Informed Consent: I have reviewed the patients History and Physical, chart, labs and discussed the procedure including the risks, benefits and alternatives for the proposed anesthesia with the patient or authorized representative who has indicated his/her understanding and acceptance.     Dental advisory given  Plan Discussed with: CRNA  Anesthesia Plan Comments:         Anesthesia Quick Evaluation

## 2022-01-04 NOTE — Anesthesia Postprocedure Evaluation (Signed)
Anesthesia Post Note  Patient: Caitlin Hardin  Procedure(s) Performed: CATARACT EXTRACTION PHACO AND INTRAOCULAR LENS PLACEMENT (IOC) RIGHT DIABETIC (Right: Eye)     Patient location during evaluation: PACU Anesthesia Type: MAC Level of consciousness: awake and alert Pain management: pain level controlled Vital Signs Assessment: post-procedure vital signs reviewed and stable Respiratory status: spontaneous breathing and nonlabored ventilation Cardiovascular status: blood pressure returned to baseline Postop Assessment: no apparent nausea or vomiting Anesthetic complications: no   No notable events documented.  Charish Schroepfer [employer]

## 2022-01-04 NOTE — Transfer of Care (Signed)
Immediate Anesthesia Transfer of Care Note  Patient: Caitlin Hardin  Procedure(s) Performed: CATARACT EXTRACTION PHACO AND INTRAOCULAR LENS PLACEMENT (IOC) RIGHT DIABETIC (Right: Eye)  Patient Location: PACU  Anesthesia Type: MAC  Level of Consciousness: awake, alert  and patient cooperative  Airway and Oxygen Therapy: Patient Spontanous Breathing and Patient connected to supplemental oxygen  Post-op Assessment: Post-op Vital signs reviewed, Patient's Cardiovascular Status Stable, Respiratory Function Stable, Patent Airway and No signs of Nausea or vomiting  Post-op Vital Signs: Reviewed and stable  Complications: No notable events documented.

## 2022-01-04 NOTE — H&P (Signed)
[facility]   Primary Care Physician:  Ellan Lambert, NP Ophthalmologist: Dr. Lockie Mola  Pre-Procedure History & Physical: HPI:  Caitlin Hardin is a 56 y.o. female here for ophthalmic surgery.   Past Medical History:  Diagnosis Date   Asthma    COPD (chronic obstructive pulmonary disease) (HCC)    GERD (gastroesophageal reflux disease)    Gout    Hypertension    OAB (overactive bladder)    Polyneuropathy    Sleep apnea    CPAP   Stroke (HCC)    Type 2 diabetes mellitus (HCC)     Past Surgical History:  Procedure Laterality Date   MOUTH SURGERY     gums    Prior to Admission medications   Medication Sig Start Date End Date Taking? Authorizing Provider  acetaminophen (TYLENOL) 325 MG tablet Take 2 tablets by mouth every 6 (six) hours as needed. 05/31/17  Yes [provider]  allopurinol (ZYLOPRIM) 100 MG tablet Take 100 mg by mouth 2 (two) times daily. 08/15/18  Yes [provider]  amLODipine (NORVASC) 5 MG tablet Take 5 mg by mouth daily.   Yes [provider]  aspirin 81 MG chewable tablet Chew 81 mg by mouth daily. 12/07/20  Yes [provider]  azelastine (ASTELIN) 0.1 % nasal spray Place 1 spray into both nostrils daily. 08/15/18  Yes [provider]  azelastine (OPTIVAR) 0.05 % ophthalmic solution Place 1 drop into both eyes daily. 08/28/18  Yes [provider]  buPROPion (WELLBUTRIN) 100 MG tablet Take 100 mg by mouth 2 (two) times daily. 08/15/18  Yes [provider]  calcium carbonate (OS-CAL - DOSED IN MG OF ELEMENTAL CALCIUM) 1250 (500 Ca) MG tablet Take 1 tablet by mouth 2 (two) times daily. 10/07/20  Yes [provider]  cetirizine (ZYRTEC) 10 MG tablet Take 10 mg by mouth daily. 08/18/16  Yes [provider]  cholecalciferol (VITAMIN D) 25 MCG (1000 UT) tablet Take 1 tablet by mouth daily. 05/24/17  Yes [provider]  cloNIDine (CATAPRES) 0.2 MG tablet Take  1 tablet by mouth 3 (three) times daily. 08/15/18  Yes [provider]  COMBIVENT RESPIMAT 20-100 MCG/ACT AERS respimat Inhale 1 puff into the lungs 4 (four) times daily. 07/26/18  Yes [provider]  diclofenac Sodium (VOLTAREN) 1 % GEL Apply 4 g topically 2 (two) times daily. To both knees and hips. 03/23/21  Yes [provider]  FEROSUL 325 (65 Fe) MG tablet Take 325 mg by mouth 2 (two) times daily. 12/07/20  Yes [provider]  FLUoxetine (PROZAC) 40 MG capsule Take 40 mg by mouth daily.   Yes [provider]  fluticasone (FLONASE) 50 MCG/ACT nasal spray Place 2 sprays into both nostrils daily. 08/02/18  Yes [provider]  gabapentin (NEURONTIN) 300 MG capsule Take 300 mg by mouth 2 (two) times daily. Take at 1400 and 2000 08/15/18  Yes [provider]  gabapentin (NEURONTIN) 400 MG capsule Take 400 mg by mouth in the morning. 03/09/21  Yes [provider]  latanoprost (XALATAN) 0.005 % ophthalmic solution Place 1 drop into both eyes at bedtime. 03/03/21  Yes [provider]  loratadine (CLARITIN) 10 MG tablet Take 10 mg by mouth daily. 10/07/20  Yes [provider]  magnesium oxide (MAG-OX) 400 MG tablet Take 400 mg by mouth daily. 05/24/17  Yes [provider]  meclizine (ANTIVERT) 25 MG tablet Take 25 mg by mouth 3 (three) times  daily as needed for dizziness.   Yes [provider]  metFORMIN (GLUCOPHAGE) 500 MG tablet Take 1.5 tablets by mouth 2 (two) times daily. 08/15/18  Yes [provider]  omeprazole (PRILOSEC) 20 MG capsule Take 20 mg by mouth daily. Take 30 minutes before breakfast 03/09/21  Yes [provider]  oxybutynin (DITROPAN) 5 MG tablet Take 1 tablet by mouth 2 (two) times daily.   Yes [provider]  OXYGEN Inhale 2 L into the lungs as needed.   Yes [provider]  sertraline (ZOLOFT) 50 MG tablet Take 25 mg by mouth daily. 10/07/20  Yes  [provider]  Zinc Oxide 13 % CREA Apply 1 application topically 2 (two) times daily.   Yes [provider]    Allergies as of 11/11/2021   (No Known Allergies)    Family History  Problem Relation Age of Onset   Breast cancer Paternal Grandmother     Social History   Socioeconomic History   Marital status: Single    Spouse name: Not on file   Number of children: Not on file   Years of education: Not on file   Highest education level: Not on file  Occupational History   Not on file  Tobacco Use   Smoking status: Never   Smokeless tobacco: Never  Vaping Use   Vaping Use: Never used  Substance and Sexual Activity   Alcohol use: No   Drug use: No   Sexual activity: Never  Other Topics Concern   Not on file  Social History Narrative   Not on file   Social Determinants of Health   Financial Resource Strain: Not on file  Food Insecurity: Not on file  Transportation Needs: Not on file  Physical Activity: Not on file  Stress: Not on file  Social Connections: Not on file  Intimate Partner Violence: Not on file    Review of Systems: See HPI, otherwise negative ROS  Physical Exam: BP (!) 122/56   Pulse 63   Temp (!) 97.3 F (36.3 C) (Temporal)   Resp 18   Ht 4' 11.02" (1.499 m)   Wt 120.2 kg   LMP 02/12/2018   SpO2 94%   BMI 53.49 kg/m  General:   Alert,  pleasant and cooperative in NAD Head:  Normocephalic and atraumatic. Lungs:  Clear to auscultation.    Heart:  Regular rate and rhythm.   Impression/Plan: Caitlin Hardin is here for ophthalmic surgery.  Risks, benefits, limitations, and alternatives regarding ophthalmic surgery have been reviewed with the patient.  Questions have been answered.  All parties agreeable.   Lockie Mola, MD  01/04/2022, 10:13 AM

## 2022-01-04 NOTE — Anesthesia Procedure Notes (Signed)
Procedure Name: MAC Date/Time: 01/04/2022 11:23 AM  Performed by: Dionne Bucy, CRNAPre-anesthesia Checklist: Patient identified, Emergency Drugs available, Suction available, Patient being monitored and Timeout performed Patient Re-evaluated:Patient Re-evaluated prior to induction Oxygen Delivery Method: Nasal cannula Placement Confirmation: positive ETCO2

## 2022-01-04 NOTE — Op Note (Signed)
  LOCATION:  [facility]   PREOPERATIVE DIAGNOSIS:    Nuclear sclerotic cataract right eye. H25.11   POSTOPERATIVE DIAGNOSIS:  Nuclear sclerotic cataract right eye.     PROCEDURE:  Phacoemusification with posterior chamber intraocular lens placement of the right eye   ULTRASOUND TIME: Procedure(s) with comments: CATARACT EXTRACTION PHACO AND INTRAOCULAR LENS PLACEMENT (IOC) RIGHT DIABETIC (Right) - 2.12 00:20.8  LENS:   Implant Name Type Inv. Item Serial No. Manufacturer Lot No. LRB No. Used Action  LENS IOL TECNIS EYHANCE 12.0 - [id number] Intraocular Lens LENS IOL TECNIS EYHANCE 12.0 [id number] [employer]  Right 1 Implanted         SURGEON:  Deirdre Evener, MD   ANESTHESIA:  Topical with tetracaine drops and 2% Xylocaine jelly, augmented with 1% preservative-free intracameral lidocaine.    COMPLICATIONS:  None.   DESCRIPTION OF PROCEDURE:  The patient was identified in the holding room and transported to the operating room and placed in the supine position under the operating microscope.  The right eye was identified as the operative eye and it was prepped and draped in the usual sterile ophthalmic fashion.   A 1 millimeter clear-corneal paracentesis was made at the 12:00 position.  0.5 ml of preservative-free 1% lidocaine was injected into the anterior chamber. The anterior chamber was filled with Viscoat viscoelastic.  A 2.4 millimeter keratome was used to make a near-clear corneal incision at the 9:00 position.  A curvilinear capsulorrhexis was made with a cystotome and capsulorrhexis forceps.  Balanced salt solution was used to hydrodissect and hydrodelineate the nucleus.   Phacoemulsification was then used in stop and chop fashion to remove the lens nucleus and epinucleus.  The remaining cortex was then removed using the irrigation and aspiration handpiece. Provisc was then placed into the capsular bag to distend it for lens placement.  A lens was then injected  into the capsular bag.  The remaining viscoelastic was aspirated.   Wounds were hydrated with balanced salt solution.  The anterior chamber was inflated to a physiologic pressure with balanced salt solution.  No wound leaks were noted. Cefuroxime 0.1 ml of a 10mg /ml solution was injected into the anterior chamber for a dose of 1 mg of intracameral antibiotic at the completion of the case.   Timolol and Brimonidine drops were applied to the eye.  The patient was taken to the recovery room in stable condition without complications of anesthesia or surgery.   Annelisa Ryback 01/04/2022, 11:35 AM

## 2022-01-05 ENCOUNTER — Encounter: Payer: Self-pay | Admitting: Ophthalmology

## 2022-01-18 ENCOUNTER — Ambulatory Visit: Payer: Medicare Other | Admitting: Anesthesiology

## 2022-01-18 ENCOUNTER — Ambulatory Visit
Admission: RE | Admit: 2022-01-18 | Discharge: 2022-01-18 | Disposition: A | Discharge status: Home / Self Care | Payer: Medicare Other | Attending: Ophthalmology | Admitting: Ophthalmology

## 2022-01-18 ENCOUNTER — Encounter
Admission: RE | Disposition: A | Discharge status: Home / Self Care | Payer: Self-pay | Source: Home / Self Care | Attending: Ophthalmology

## 2022-01-18 ENCOUNTER — Encounter: Payer: Self-pay | Admitting: Ophthalmology

## 2022-01-18 ENCOUNTER — Other Ambulatory Visit: Payer: Self-pay

## 2022-01-18 DIAGNOSIS — H2512 Age-related nuclear cataract, left eye: Secondary | ICD-10-CM | POA: Diagnosis present

## 2022-01-18 DIAGNOSIS — Z79899 Other long term (current) drug therapy: Secondary | ICD-10-CM | POA: Insufficient documentation

## 2022-01-18 DIAGNOSIS — N3281 Overactive bladder: Secondary | ICD-10-CM | POA: Diagnosis not present

## 2022-01-18 DIAGNOSIS — J449 Chronic obstructive pulmonary disease, unspecified: Secondary | ICD-10-CM | POA: Diagnosis not present

## 2022-01-18 DIAGNOSIS — Z7984 Long term (current) use of oral hypoglycemic drugs: Secondary | ICD-10-CM | POA: Diagnosis not present

## 2022-01-18 DIAGNOSIS — G4730 Sleep apnea, unspecified: Secondary | ICD-10-CM | POA: Insufficient documentation

## 2022-01-18 DIAGNOSIS — K219 Gastro-esophageal reflux disease without esophagitis: Secondary | ICD-10-CM | POA: Insufficient documentation

## 2022-01-18 DIAGNOSIS — E1136 Type 2 diabetes mellitus with diabetic cataract: Secondary | ICD-10-CM | POA: Diagnosis not present

## 2022-01-18 DIAGNOSIS — I10 Essential (primary) hypertension: Secondary | ICD-10-CM | POA: Insufficient documentation

## 2022-01-18 HISTORY — PX: CATARACT EXTRACTION W/PHACO: SHX586

## 2022-01-18 LAB — GLUCOSE, CAPILLARY
Glucose-Capillary: 101 mg/dL — ABNORMAL HIGH (ref 70–99)
Glucose-Capillary: 106 mg/dL — ABNORMAL HIGH (ref 70–99)

## 2022-01-18 SURGERY — PHACOEMULSIFICATION, CATARACT, WITH IOL INSERTION
Anesthesia: Monitor Anesthesia Care | Site: Eye | Laterality: Left

## 2022-01-18 MED ORDER — SIGHTPATH DOSE#1 BSS IO SOLN
INTRAOCULAR | Status: DC | PRN
  Administered 2022-01-18: 52 mL via OPHTHALMIC

## 2022-01-18 MED ORDER — TETRACAINE HCL 0.5 % OP SOLN
1.0000 [drp] | OPHTHALMIC | Status: DC | PRN
  Administered 2022-01-18 (×3): 1 [drp] via OPHTHALMIC

## 2022-01-18 MED ORDER — FENTANYL CITRATE (PF) 100 MCG/2ML IJ SOLN
INTRAMUSCULAR | Status: DC | PRN
  Administered 2022-01-18: 50 ug via INTRAVENOUS

## 2022-01-18 MED ORDER — ARMC OPHTHALMIC DILATING DROPS
1.0000 "application " | OPHTHALMIC | Status: DC | PRN
  Administered 2022-01-18 (×3): 1 via OPHTHALMIC

## 2022-01-18 MED ORDER — SIGHTPATH DOSE#1 NA HYALUR & NA CHOND-NA HYALUR IO KIT
PACK | INTRAOCULAR | Status: DC | PRN
  Administered 2022-01-18: 1 via OPHTHALMIC

## 2022-01-18 MED ORDER — BRIMONIDINE TARTRATE-TIMOLOL 0.2-0.5 % OP SOLN
OPHTHALMIC | Status: DC | PRN
  Administered 2022-01-18: 1 [drp] via OPHTHALMIC

## 2022-01-18 MED ORDER — MIDAZOLAM HCL 2 MG/2ML IJ SOLN
INTRAMUSCULAR | Status: DC | PRN
  Administered 2022-01-18: 2 mg via INTRAVENOUS

## 2022-01-18 MED ORDER — CEFUROXIME OPHTHALMIC INJECTION 1 MG/0.1 ML
INJECTION | OPHTHALMIC | Status: DC | PRN
  Administered 2022-01-18: 0.1 mL via INTRACAMERAL

## 2022-01-18 MED ORDER — SIGHTPATH DOSE#1 BSS IO SOLN
INTRAOCULAR | Status: DC | PRN
  Administered 2022-01-18: 2 mL

## 2022-01-18 MED ORDER — SIGHTPATH DOSE#1 BSS IO SOLN
INTRAOCULAR | Status: DC | PRN
  Administered 2022-01-18: 15 mL via INTRAOCULAR

## 2022-01-18 MED ORDER — LACTATED RINGERS IV SOLN: INTRAVENOUS | Status: DC

## 2022-01-18 SURGICAL SUPPLY — 13 items
CANNULA ANT/CHMB 27G (MISCELLANEOUS) IMPLANT
CANNULA ANT/CHMB 27GA (MISCELLANEOUS) IMPLANT
CATARACT SUITE SIGHTPATH (MISCELLANEOUS) ×2 IMPLANT
FEE CATARACT SUITE SIGHTPATH (MISCELLANEOUS) ×1 IMPLANT
GLOVE SRG 8 PF TXTR STRL LF DI (GLOVE) ×1 IMPLANT
GLOVE SURG ENC TEXT LTX SZ7.5 (GLOVE) ×2 IMPLANT
GLOVE SURG UNDER POLY LF SZ8 (GLOVE) ×2
LENS IOL TECNIS EYHANCE 16.0 (Intraocular Lens) ×1 IMPLANT
NDL FILTER BLUNT 18X1 1/2 (NEEDLE) ×1 IMPLANT
NEEDLE FILTER BLUNT 18X 1/2SAF (NEEDLE) ×1
NEEDLE FILTER BLUNT 18X1 1/2 (NEEDLE) ×1 IMPLANT
SYR 3ML LL SCALE MARK (SYRINGE) ×2 IMPLANT
WATER STERILE IRR 250ML POUR (IV SOLUTION) ×2 IMPLANT

## 2022-01-18 NOTE — Transfer of Care (Signed)
Immediate Anesthesia Transfer of Care Note  Patient: Caitlin Hardin  Procedure(s) Performed: CATARACT EXTRACTION PHACO AND INTRAOCULAR LENS PLACEMENT (IOC) LEFT DIABETIC 1.15 00:20.5 (Left: Eye)  Patient Location: PACU  Anesthesia Type: MAC  Level of Consciousness: awake, alert  and patient cooperative  Airway and Oxygen Therapy: Patient Spontanous Breathing and Patient connected to supplemental oxygen  Post-op Assessment: Post-op Vital signs reviewed, Patient's Cardiovascular Status Stable, Respiratory Function Stable, Patent Airway and No signs of Nausea or vomiting  Post-op Vital Signs: Reviewed and stable  Complications: No notable events documented.

## 2022-01-18 NOTE — H&P (Signed)
[facility]   Primary Care Physician:  Ellan Lambert, NP Ophthalmologist: Dr. Lockie Mola  Pre-Procedure History & Physical: HPI:  Caitlin Hardin is a 56 y.o. female here for ophthalmic surgery.   Past Medical History:  Diagnosis Date   Asthma    COPD (chronic obstructive pulmonary disease) (HCC)    GERD (gastroesophageal reflux disease)    Gout    Hypertension    OAB (overactive bladder)    Polyneuropathy    Sleep apnea    CPAP   Stroke (HCC)    Type 2 diabetes mellitus (HCC)     Past Surgical History:  Procedure Laterality Date   CATARACT EXTRACTION W/PHACO Right 01/04/2022   Procedure: CATARACT EXTRACTION PHACO AND INTRAOCULAR LENS PLACEMENT (IOC) RIGHT DIABETIC;  Surgeon: Lockie Mola, MD;  Location: [facility];  Service: Ophthalmology;  Laterality: Right;  2.12 00:20.8   MOUTH SURGERY     gums    Prior to Admission medications   Medication Sig Start Date End Date Taking? Authorizing Provider  acetaminophen (TYLENOL) 325 MG tablet Take 2 tablets by mouth every 6 (six) hours as needed. 05/31/17   [provider]  allopurinol (ZYLOPRIM) 100 MG tablet Take 100 mg by mouth 2 (two) times daily. 08/15/18   [provider]  amLODipine (NORVASC) 5 MG tablet Take 5 mg by mouth daily.    [provider]  aspirin 81 MG chewable tablet Chew 81 mg by mouth daily. 12/07/20   [provider]  azelastine (ASTELIN) 0.1 % nasal spray Place 1 spray into both nostrils daily. 08/15/18   [provider]  azelastine (OPTIVAR) 0.05 % ophthalmic solution Place 1 drop into both eyes daily. 08/28/18   [provider]  buPROPion (WELLBUTRIN) 100 MG tablet Take 100 mg by mouth 2 (two) times daily. 08/15/18   [provider]  calcium carbonate (OS-CAL - DOSED IN MG OF ELEMENTAL CALCIUM) 1250 (500 Ca) MG tablet Take 1 tablet by mouth 2 (two) times daily. 10/07/20   [provider]  cetirizine  (ZYRTEC) 10 MG tablet Take 10 mg by mouth daily. 08/18/16   [provider]  cholecalciferol (VITAMIN D) 25 MCG (1000 UT) tablet Take 1 tablet by mouth daily. 05/24/17   [provider]  cloNIDine (CATAPRES) 0.2 MG tablet Take 1 tablet by mouth 3 (three) times daily. 08/15/18   [provider]  COMBIVENT RESPIMAT 20-100 MCG/ACT AERS respimat Inhale 1 puff into the lungs 4 (four) times daily. 07/26/18   [provider]  diclofenac Sodium (VOLTAREN) 1 % GEL Apply 4 g topically 2 (two) times daily. To both knees and hips. 03/23/21   [provider]  FEROSUL 325 (65 Fe) MG tablet Take 325 mg by mouth 2 (two) times daily. 12/07/20   [provider]  FLUoxetine (PROZAC) 40 MG capsule Take 40 mg by mouth daily.    [provider]  fluticasone (FLONASE) 50 MCG/ACT nasal spray Place 2 sprays into both nostrils daily. 08/02/18   [provider]  gabapentin (NEURONTIN) 300 MG capsule Take 300 mg by mouth 2 (two) times daily. Take at 1400 and 2000 08/15/18   [provider]  gabapentin (NEURONTIN) 400 MG capsule Take 400 mg by mouth in the morning. 03/09/21   [provider]  latanoprost (XALATAN) 0.005 % ophthalmic solution Place 1 drop into both eyes at bedtime. 03/03/21   [provider]  loratadine (CLARITIN) 10 MG tablet Take 10 mg by mouth daily.  10/07/20   [provider]  magnesium oxide (MAG-OX) 400 MG tablet Take 400 mg by mouth daily. 05/24/17   [provider]  meclizine (ANTIVERT) 25 MG tablet Take 25 mg by mouth 3 (three) times daily as needed for dizziness.    [provider]  metFORMIN (GLUCOPHAGE) 500 MG tablet Take 1.5 tablets by mouth 2 (two) times daily. 08/15/18   [provider]  omeprazole (PRILOSEC) 20 MG capsule Take 20 mg by mouth daily. Take 30 minutes before breakfast 03/09/21   [provider]  oxybutynin (DITROPAN) 5 MG tablet Take 1 tablet by mouth 2  (two) times daily.    [provider]  OXYGEN Inhale 2 L into the lungs as needed.    [provider]  sertraline (ZOLOFT) 50 MG tablet Take 25 mg by mouth daily. 10/07/20   [provider]  Zinc Oxide 13 % CREA Apply 1 application topically 2 (two) times daily.    [provider]    Allergies as of 11/11/2021   (No Known Allergies)    Family History  Problem Relation Age of Onset   Breast cancer Paternal Grandmother     Social History   Socioeconomic History   Marital status: Single    Spouse name: Not on file   Number of children: Not on file   Years of education: Not on file   Highest education level: Not on file  Occupational History   Not on file  Tobacco Use   Smoking status: Never   Smokeless tobacco: Never  Vaping Use   Vaping Use: Never used  Substance and Sexual Activity   Alcohol use: No   Drug use: No   Sexual activity: Never  Other Topics Concern   Not on file  Social History Narrative   Not on file   Social Determinants of Health   Financial Resource Strain: Not on file  Food Insecurity: Not on file  Transportation Needs: Not on file  Physical Activity: Not on file  Stress: Not on file  Social Connections: Not on file  Intimate Partner Violence: Not on file    Review of Systems: See HPI, otherwise negative ROS  Physical Exam: BP (!) 132/47   Pulse (!) 53   Temp (!) 97 F (36.1 C) (Temporal)   Resp 16   Ht 4\' 11"  (1.499 m)   Wt 120.7 kg   LMP 02/12/2018   SpO2 96%   BMI 53.73 kg/m  General:   Alert,  pleasant and cooperative in NAD Head:  Normocephalic and atraumatic. Lungs:  Clear to auscultation.    Heart:  Regular rate and rhythm.   Impression/Plan: Caitlin Hardin is here for ophthalmic surgery.  Risks, benefits, limitations, and alternatives regarding ophthalmic surgery have been reviewed with the patient.  Questions have been answered.  All parties agreeable.   Durel Salts, MD   01/18/2022, 8:52 AM

## 2022-01-18 NOTE — Op Note (Signed)
  OPERATIVE NOTE  Zoella Roberti [id number] 01/18/2022   PREOPERATIVE DIAGNOSIS:  Nuclear sclerotic cataract left eye. H25.12   POSTOPERATIVE DIAGNOSIS:    Nuclear sclerotic cataract left eye.     PROCEDURE:  Phacoemusification with posterior chamber intraocular lens placement of the left eye  Ultrasound time: Procedure(s): CATARACT EXTRACTION PHACO AND INTRAOCULAR LENS PLACEMENT (IOC) LEFT DIABETIC 1.15 00:20.5 (Left)  LENS:   Implant Name Type Inv. Item Serial No. Manufacturer Lot No. LRB No. Used Action  LENS IOL TECNIS EYHANCE 16.0 - [id number] Intraocular Lens LENS IOL TECNIS EYHANCE 16.0 [id number] [employer]  Left 1 Implanted      SURGEON:  Deirdre Evener, MD   ANESTHESIA:  Topical with tetracaine drops and 2% Xylocaine jelly, augmented with 1% preservative-free intracameral lidocaine.    COMPLICATIONS:  None.   DESCRIPTION OF PROCEDURE:  The patient was identified in the holding room and transported to the operating room and placed in the supine position under the operating microscope.  The left eye was identified as the operative eye and it was prepped and draped in the usual sterile ophthalmic fashion.   A 1 millimeter clear-corneal paracentesis was made at the 1:30 position.  0.5 ml of preservative-free 1% lidocaine was injected into the anterior chamber.  The anterior chamber was filled with Viscoat viscoelastic.  A 2.4 millimeter keratome was used to make a near-clear corneal incision at the 10:30 position.  .  A curvilinear capsulorrhexis was made with a cystotome and capsulorrhexis forceps.  Balanced salt solution was used to hydrodissect and hydrodelineate the nucleus.   Phacoemulsification was then used in stop and chop fashion to remove the lens nucleus and epinucleus.  The remaining cortex was then removed using the irrigation and aspiration handpiece. Provisc was then placed into the capsular bag to distend it for lens placement.  A lens was then injected  into the capsular bag.  The remaining viscoelastic was aspirated.   Wounds were hydrated with balanced salt solution.  The anterior chamber was inflated to a physiologic pressure with balanced salt solution.  No wound leaks were noted. Cefuroxime 0.1 ml of a 10mg /ml solution was injected into the anterior chamber for a dose of 1 mg of intracameral antibiotic at the completion of the case.   Timolol and Brimonidine drops were applied to the eye.  The patient was taken to the recovery room in stable condition without complications of anesthesia or surgery.  Luc Shammas 01/18/2022, 9:46 AM

## 2022-01-18 NOTE — Anesthesia Preprocedure Evaluation (Signed)
Anesthesia Evaluation  Patient identified by MRN, date of birth, ID band Patient awake    Reviewed: Allergy & Precautions, H&P , NPO status , Patient's Chart, lab work & pertinent test results, reviewed documented beta blocker date and time   Airway Mallampati: II  TM Distance: >3 FB Neck ROM: full    Dental   Poor dentition, none loose:   Pulmonary asthma , sleep apnea , COPD,    Pulmonary exam normal breath sounds clear to auscultation       Cardiovascular Exercise Tolerance: Good hypertension, Normal cardiovascular exam Rhythm:regular Rate:Normal     Neuro/Psych negative neurological ROS  negative psych ROS   GI/Hepatic Neg liver ROS, GERD  ,  Endo/Other  negative endocrine ROSdiabetes  Renal/GU negative Renal ROS  negative genitourinary   Musculoskeletal   Abdominal   Peds  Hematology negative hematology ROS (+)   Anesthesia Other Findings   Reproductive/Obstetrics negative OB ROS                             Anesthesia Physical Anesthesia Plan  ASA: 3  Anesthesia Plan: MAC   Post-op Pain Management:    Induction:   PONV Risk Score and Plan:   Airway Management Planned:   Additional Equipment:   Intra-op Plan:   Post-operative Plan:   Informed Consent: I have reviewed the patients History and Physical, chart, labs and discussed the procedure including the risks, benefits and alternatives for the proposed anesthesia with the patient or authorized representative who has indicated his/her understanding and acceptance.     Dental Advisory Given  Plan Discussed with: CRNA and Anesthesiologist  Anesthesia Plan Comments:         Anesthesia Quick Evaluation

## 2022-01-18 NOTE — Op Note (Signed)
[facility]   Primary Care Physician:  Ellan Lambert, NP Ophthalmologist: Dr. Lockie Mola  Pre-Procedure History & Physical: HPI:  Caitlin Hardin is a 56 y.o. female here for ophthalmic surgery.   Past Medical History:  Diagnosis Date   Asthma    COPD (chronic obstructive pulmonary disease) (HCC)    GERD (gastroesophageal reflux disease)    Gout    Hypertension    OAB (overactive bladder)    Polyneuropathy    Sleep apnea    CPAP   Stroke (HCC)    Type 2 diabetes mellitus (HCC)     Past Surgical History:  Procedure Laterality Date   CATARACT EXTRACTION W/PHACO Right 01/04/2022   Procedure: CATARACT EXTRACTION PHACO AND INTRAOCULAR LENS PLACEMENT (IOC) RIGHT DIABETIC;  Surgeon: Lockie Mola, MD;  Location: [facility];  Service: Ophthalmology;  Laterality: Right;  2.12 00:20.8   MOUTH SURGERY     gums    Prior to Admission medications   Medication Sig Start Date End Date Taking? Authorizing Provider  acetaminophen (TYLENOL) 325 MG tablet Take 2 tablets by mouth every 6 (six) hours as needed. 05/31/17   [provider]  allopurinol (ZYLOPRIM) 100 MG tablet Take 100 mg by mouth 2 (two) times daily. 08/15/18   [provider]  amLODipine (NORVASC) 5 MG tablet Take 5 mg by mouth daily.    [provider]  aspirin 81 MG chewable tablet Chew 81 mg by mouth daily. 12/07/20   [provider]  azelastine (ASTELIN) 0.1 % nasal spray Place 1 spray into both nostrils daily. 08/15/18   [provider]  azelastine (OPTIVAR) 0.05 % ophthalmic solution Place 1 drop into both eyes daily. 08/28/18   [provider]  buPROPion (WELLBUTRIN) 100 MG tablet Take 100 mg by mouth 2 (two) times daily. 08/15/18   [provider]  calcium carbonate (OS-CAL - DOSED IN MG OF ELEMENTAL CALCIUM) 1250 (500 Ca) MG tablet Take 1 tablet by mouth 2 (two) times daily. 10/07/20   [provider]  cetirizine (ZYRTEC)  10 MG tablet Take 10 mg by mouth daily. 08/18/16   [provider]  cholecalciferol (VITAMIN D) 25 MCG (1000 UT) tablet Take 1 tablet by mouth daily. 05/24/17   [provider]  cloNIDine (CATAPRES) 0.2 MG tablet Take 1 tablet by mouth 3 (three) times daily. 08/15/18   [provider]  COMBIVENT RESPIMAT 20-100 MCG/ACT AERS respimat Inhale 1 puff into the lungs 4 (four) times daily. 07/26/18   [provider]  diclofenac Sodium (VOLTAREN) 1 % GEL Apply 4 g topically 2 (two) times daily. To both knees and hips. 03/23/21   [provider]  FEROSUL 325 (65 Fe) MG tablet Take 325 mg by mouth 2 (two) times daily. 12/07/20   [provider]  FLUoxetine (PROZAC) 40 MG capsule Take 40 mg by mouth daily.    [provider]  fluticasone (FLONASE) 50 MCG/ACT nasal spray Place 2 sprays into both nostrils daily. 08/02/18   [provider]  gabapentin (NEURONTIN) 300 MG capsule Take 300 mg by mouth 2 (two) times daily. Take at 1400 and 2000 08/15/18   [provider]  gabapentin (NEURONTIN) 400 MG capsule Take 400 mg by mouth in the morning. 03/09/21   [provider]  latanoprost (XALATAN) 0.005 % ophthalmic solution Place 1 drop into both eyes at bedtime. 03/03/21   [provider]  loratadine (CLARITIN) 10 MG tablet Take 10 mg by mouth daily.  10/07/20   [provider]  magnesium oxide (MAG-OX) 400 MG tablet Take 400 mg by mouth daily. 05/24/17   [provider]  meclizine (ANTIVERT) 25 MG tablet Take 25 mg by mouth 3 (three) times daily as needed for dizziness.    [provider]  metFORMIN (GLUCOPHAGE) 500 MG tablet Take 1.5 tablets by mouth 2 (two) times daily. 08/15/18   [provider]  omeprazole (PRILOSEC) 20 MG capsule Take 20 mg by mouth daily. Take 30 minutes before breakfast 03/09/21   [provider]  oxybutynin (DITROPAN) 5 MG tablet Take 1 tablet by mouth 2 (two) times  daily.    [provider]  OXYGEN Inhale 2 L into the lungs as needed.    [provider]  sertraline (ZOLOFT) 50 MG tablet Take 25 mg by mouth daily. 10/07/20   [provider]  Zinc Oxide 13 % CREA Apply 1 application topically 2 (two) times daily.    [provider]    Allergies as of 11/11/2021   (No Known Allergies)    Family History  Problem Relation Age of Onset   Breast cancer Paternal Grandmother     Social History   Socioeconomic History   Marital status: Single    Spouse name: Not on file   Number of children: Not on file   Years of education: Not on file   Highest education level: Not on file  Occupational History   Not on file  Tobacco Use   Smoking status: Never   Smokeless tobacco: Never  Vaping Use   Vaping Use: Never used  Substance and Sexual Activity   Alcohol use: No   Drug use: No   Sexual activity: Never  Other Topics Concern   Not on file  Social History Narrative   Not on file   Social Determinants of Health   Financial Resource Strain: Not on file  Food Insecurity: Not on file  Transportation Needs: Not on file  Physical Activity: Not on file  Stress: Not on file  Social Connections: Not on file  Intimate Partner Violence: Not on file    Review of Systems: See HPI, otherwise negative ROS  Physical Exam: BP (!) 132/47   Pulse (!) 53   Temp (!) 97 F (36.1 C) (Temporal)   Resp 16   Ht 4\' 11"  (1.499 m)   Wt 120.7 kg   LMP 02/12/2018   SpO2 96%   BMI 53.73 kg/m  General:   Alert,  pleasant and cooperative in NAD Head:  Normocephalic and atraumatic. Lungs:  Clear to auscultation.    Heart:  Regular rate and rhythm.   Impression/Plan: Caitlin Hardin is here for ophthalmic surgery.  Risks, benefits, limitations, and alternatives regarding ophthalmic surgery have been reviewed with the patient.  Questions have been answered.  All parties agreeable.   Durel Salts, MD  01/18/2022,  9:27 AM

## 2022-01-18 NOTE — Anesthesia Postprocedure Evaluation (Signed)
Anesthesia Post Note  Patient: Caitlin Hardin  Procedure(s) Performed: CATARACT EXTRACTION PHACO AND INTRAOCULAR LENS PLACEMENT (IOC) LEFT DIABETIC 1.15 00:20.5 (Left: Eye)     Patient location during evaluation: PACU Anesthesia Type: MAC Level of consciousness: awake and alert Pain management: pain level controlled Vital Signs Assessment: post-procedure vital signs reviewed and stable Respiratory status: spontaneous breathing, nonlabored ventilation, respiratory function stable and patient connected to nasal cannula oxygen Cardiovascular status: stable and blood pressure returned to baseline Postop Assessment: no apparent nausea or vomiting Anesthetic complications: no   No notable events documented.  Trecia Rogers

## 2022-01-18 NOTE — Anesthesia Procedure Notes (Signed)
Procedure Name: MAC Date/Time: 01/18/2022 9:26 AM  Performed by: Jeannene Patella, CRNAPre-anesthesia Checklist: Patient identified, Emergency Drugs available, Suction available, Timeout performed and Patient being monitored Patient Re-evaluated:Patient Re-evaluated prior to induction Oxygen Delivery Method: Nasal cannula Placement Confirmation: positive ETCO2

## 2022-02-21 IMAGING — CT CT RENAL STONE PROTOCOL
2 of 4 series · 17 of 46 positions shown, 19 images · non-contrast
Comparison: None.

CLINICAL DATA: Urinary tract stone, symptomatic/complicated.
Elevated serum creatinine.

EXAM:
CT ABDOMEN AND PELVIS WITHOUT CONTRAST
TECHNIQUE: Multidetector CT imaging of the abdomen and pelvis was performed
following the standard protocol without IV contrast.

[Series 2: stone full standard · axial · 0.98mm/px · z∈[-909,-484]mm · 14 of 93 slices shown, 16 images]
[im 4/93  soft-tissue]
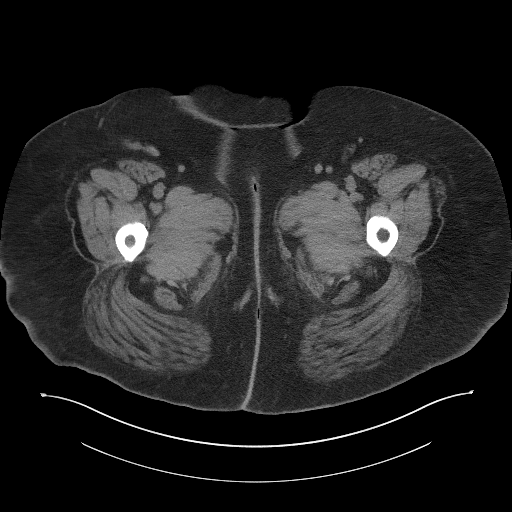
[im 4/93  bone]
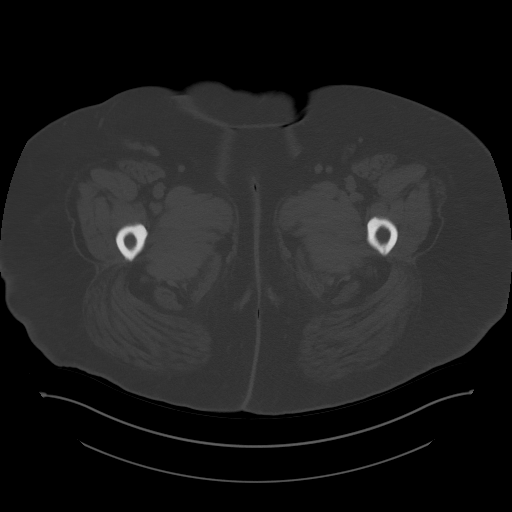
[im 12/93  soft-tissue]
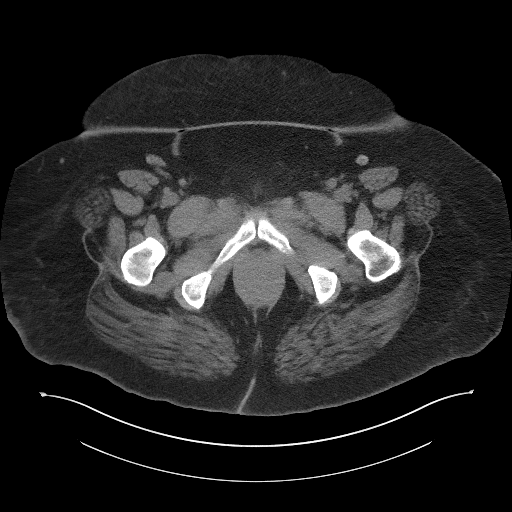
[im 20/93  soft-tissue]
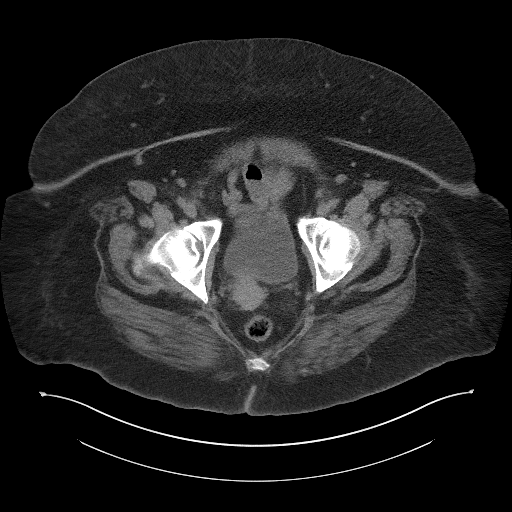
[im 24/93  soft-tissue]
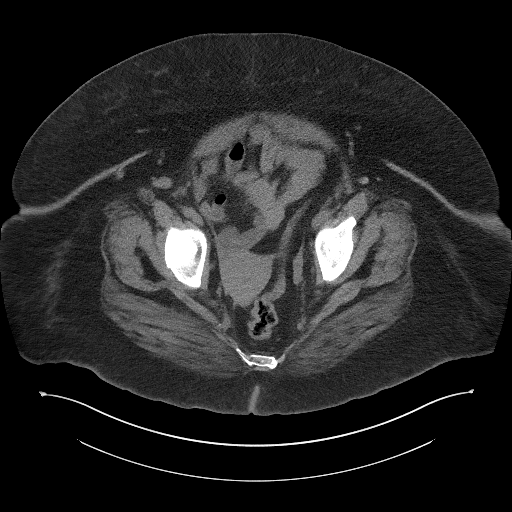
[im 31/93  soft-tissue]
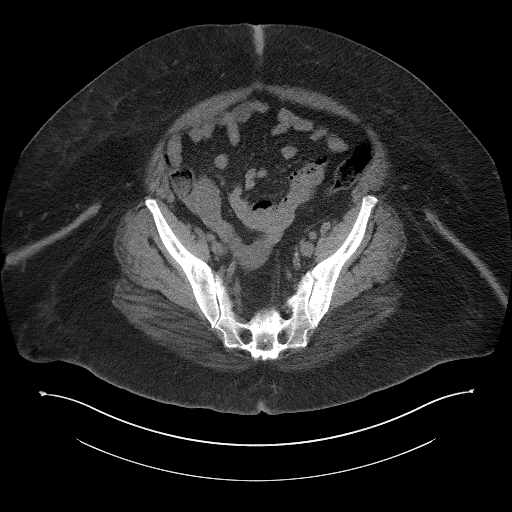
[im 39/93  soft-tissue]
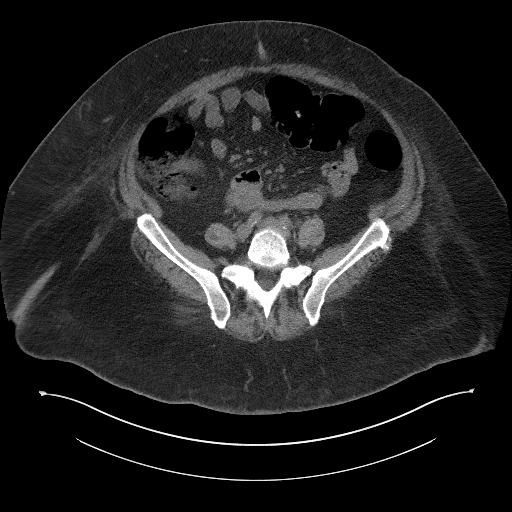
[im 43/93  soft-tissue]
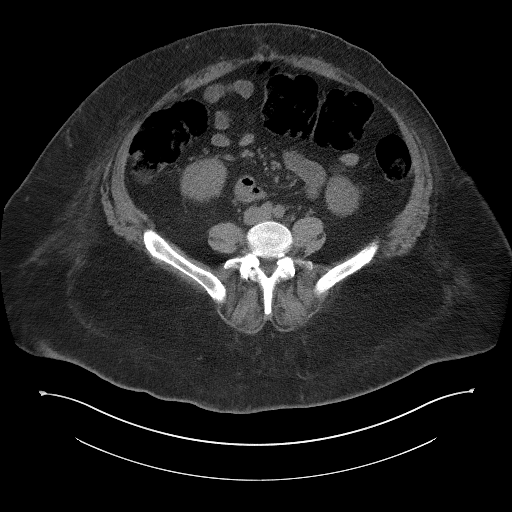
[im 50/93  soft-tissue]
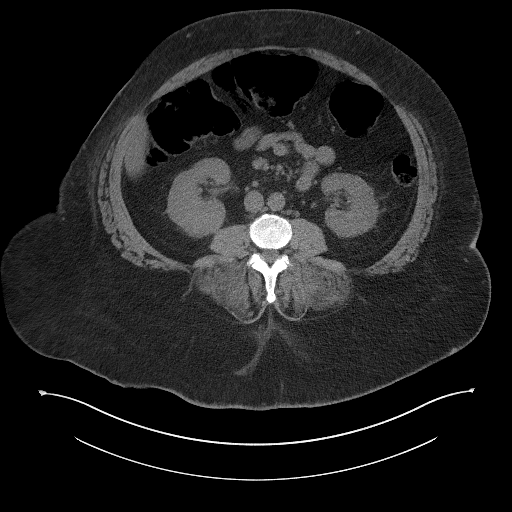
[im 54/93  soft-tissue]
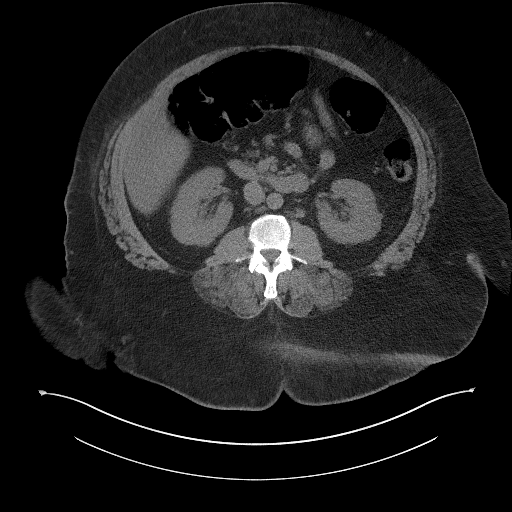
[im 54/93  bone]
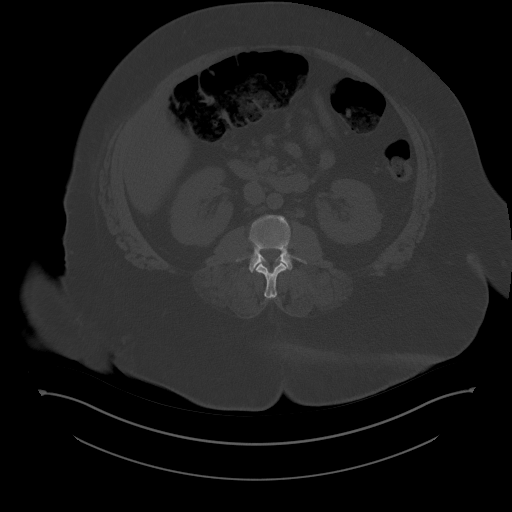
[im 62/93  soft-tissue]
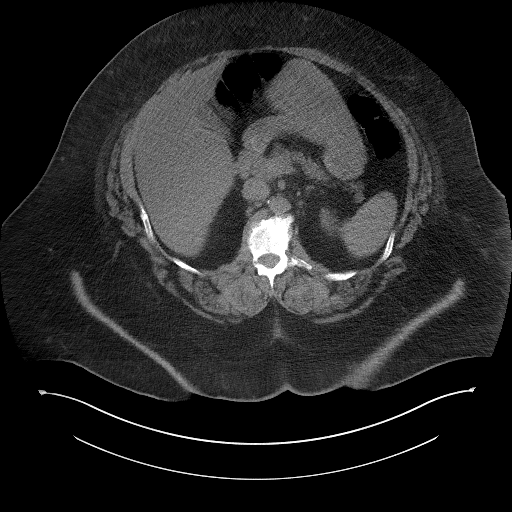
[im 70/93  soft-tissue]
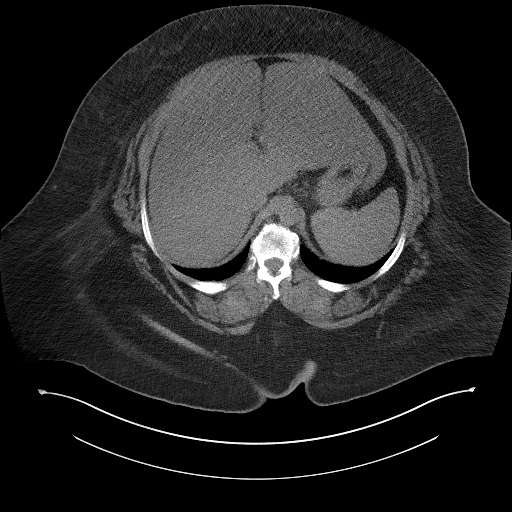
[im 73/93  soft-tissue]
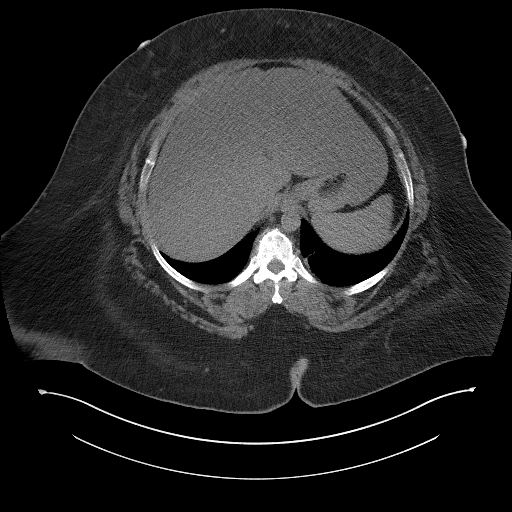
[im 81/93  soft-tissue]
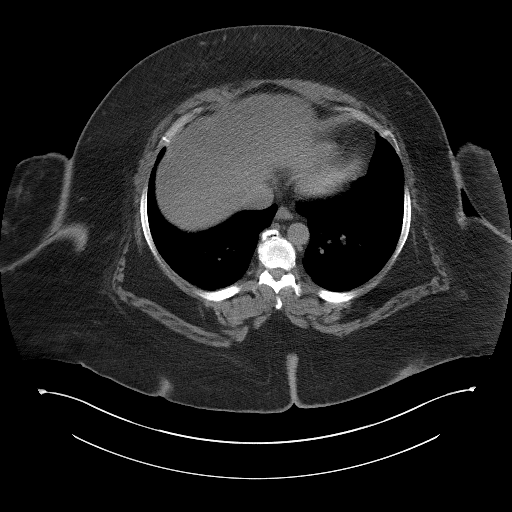
[im 89/93  soft-tissue]
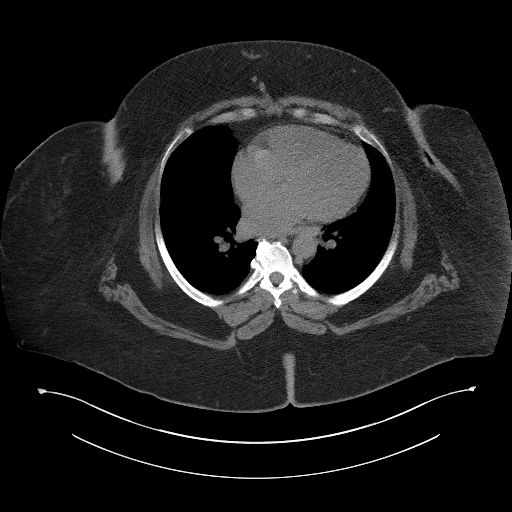

[Series 5: coronal · coronal · 0.96mm/px · 3 of 190 slices shown]
[im 64/190  soft-tissue]
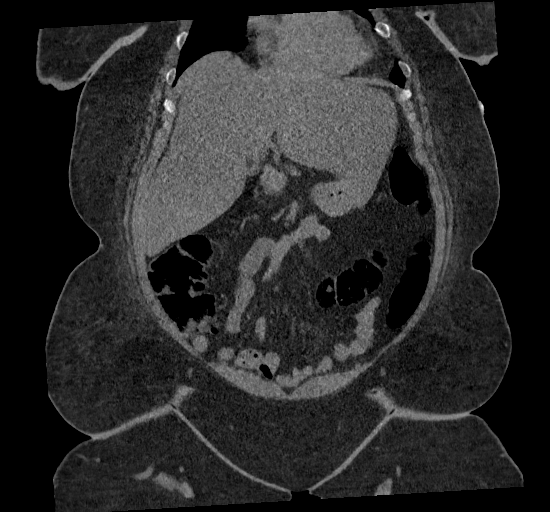
[im 85/190  soft-tissue]
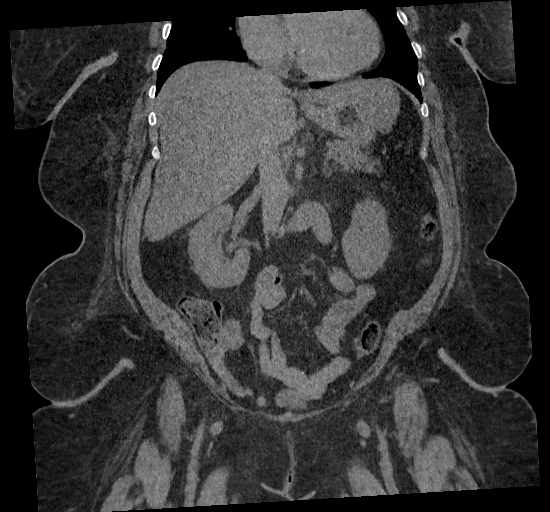
[im 106/190  soft-tissue]
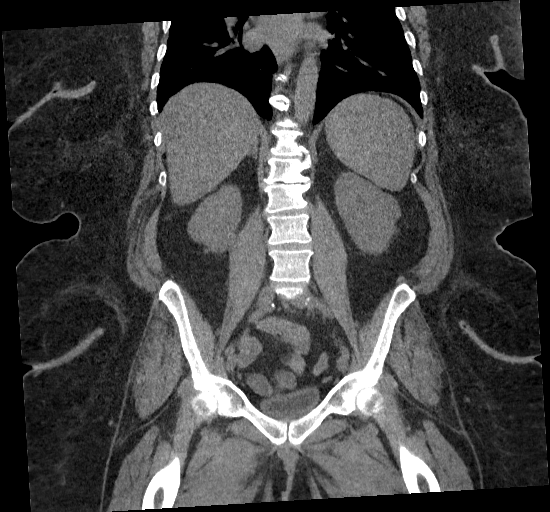

[17 of 46 positions shown; findings below may reference images not displayed]

FINDINGS: Lower chest: The visualized lung bases are clear bilaterally. The
visualized heart and pericardium are unremarkable.

Hepatobiliary: Mild to moderate hepatic steatosis. No definite
intrahepatic mass on this noncontrast examination. No intra or
extrahepatic biliary ductal dilation. Gallbladder unremarkable.

Pancreas: Unremarkable

Spleen: Unremarkable

Adrenals/Urinary Tract: Adrenal glands are unremarkable. Kidneys are
normal, without renal calculi, focal lesion, or hydronephrosis.
Bladder is unremarkable.

Stomach/Bowel: Stomach is within normal limits. Appendix appears
normal. No evidence of bowel wall thickening, distention, or
inflammatory changes. No free intraperitoneal gas or fluid.

Vascular/Lymphatic: Mild aortoiliac atherosclerotic calcification.
No aortic aneurysm. No pathologic adenopathy within the abdomen and
pelvis.

Reproductive: Uterus and bilateral adnexa are unremarkable.

Other: No abdominal wall hernia.  The rectum is unremarkable.

Musculoskeletal: No acute bone abnormality. Degenerative changes are
seen within the lumbar spine. No lytic or blastic bone lesion.
IMPRESSION: No acute intra-abdominal pathology identified. No definite
radiographic explanation for the patient's reported symptoms.

Mild to moderate hepatic steatosis.

Aortic Atherosclerosis ([id number].G).

## 2022-10-22 ENCOUNTER — Emergency Department: Payer: Medicare Other

## 2022-10-22 ENCOUNTER — Inpatient Hospital Stay
Admission: EM | Admit: 2022-10-22 | Discharge: 2022-11-02 | DRG: 872 | Disposition: A | Discharge status: Home Health | Payer: Medicare Other | Source: Skilled Nursing Facility | Attending: Student | Admitting: Student

## 2022-10-22 ENCOUNTER — Encounter: Payer: Self-pay | Admitting: Internal Medicine

## 2022-10-22 ENCOUNTER — Other Ambulatory Visit: Payer: Self-pay

## 2022-10-22 DIAGNOSIS — G4733 Obstructive sleep apnea (adult) (pediatric): Secondary | ICD-10-CM | POA: Diagnosis present

## 2022-10-22 DIAGNOSIS — Z7984 Long term (current) use of oral hypoglycemic drugs: Secondary | ICD-10-CM | POA: Diagnosis not present

## 2022-10-22 DIAGNOSIS — N179 Acute kidney failure, unspecified: Secondary | ICD-10-CM | POA: Diagnosis present

## 2022-10-22 DIAGNOSIS — Z6841 Body Mass Index (BMI) 40.0 and over, adult: Secondary | ICD-10-CM | POA: Diagnosis not present

## 2022-10-22 DIAGNOSIS — J9611 Chronic respiratory failure with hypoxia: Secondary | ICD-10-CM | POA: Diagnosis present

## 2022-10-22 DIAGNOSIS — E559 Vitamin D deficiency, unspecified: Secondary | ICD-10-CM | POA: Diagnosis present

## 2022-10-22 DIAGNOSIS — M109 Gout, unspecified: Secondary | ICD-10-CM | POA: Diagnosis present

## 2022-10-22 DIAGNOSIS — N390 Urinary tract infection, site not specified: Secondary | ICD-10-CM | POA: Diagnosis present

## 2022-10-22 DIAGNOSIS — N1831 Chronic kidney disease, stage 3a: Secondary | ICD-10-CM | POA: Diagnosis present

## 2022-10-22 DIAGNOSIS — E66813 Obesity, class 3: Secondary | ICD-10-CM

## 2022-10-22 DIAGNOSIS — Z9981 Dependence on supplemental oxygen: Secondary | ICD-10-CM | POA: Diagnosis not present

## 2022-10-22 DIAGNOSIS — Z79899 Other long term (current) drug therapy: Secondary | ICD-10-CM

## 2022-10-22 DIAGNOSIS — E119 Type 2 diabetes mellitus without complications: Secondary | ICD-10-CM

## 2022-10-22 DIAGNOSIS — J449 Chronic obstructive pulmonary disease, unspecified: Secondary | ICD-10-CM | POA: Insufficient documentation

## 2022-10-22 DIAGNOSIS — D509 Iron deficiency anemia, unspecified: Secondary | ICD-10-CM | POA: Diagnosis present

## 2022-10-22 DIAGNOSIS — G629 Polyneuropathy, unspecified: Secondary | ICD-10-CM | POA: Diagnosis present

## 2022-10-22 DIAGNOSIS — A419 Sepsis, unspecified organism: Secondary | ICD-10-CM

## 2022-10-22 DIAGNOSIS — E1122 Type 2 diabetes mellitus with diabetic chronic kidney disease: Secondary | ICD-10-CM | POA: Diagnosis present

## 2022-10-22 DIAGNOSIS — I129 Hypertensive chronic kidney disease with stage 1 through stage 4 chronic kidney disease, or unspecified chronic kidney disease: Secondary | ICD-10-CM | POA: Diagnosis present

## 2022-10-22 DIAGNOSIS — F32A Depression, unspecified: Secondary | ICD-10-CM | POA: Diagnosis present

## 2022-10-22 DIAGNOSIS — N3281 Overactive bladder: Secondary | ICD-10-CM | POA: Diagnosis present

## 2022-10-22 DIAGNOSIS — Z8673 Personal history of transient ischemic attack (TIA), and cerebral infarction without residual deficits: Secondary | ICD-10-CM

## 2022-10-22 DIAGNOSIS — K219 Gastro-esophageal reflux disease without esophagitis: Secondary | ICD-10-CM | POA: Diagnosis present

## 2022-10-22 DIAGNOSIS — Z7982 Long term (current) use of aspirin: Secondary | ICD-10-CM | POA: Diagnosis not present

## 2022-10-22 DIAGNOSIS — Z1152 Encounter for screening for COVID-19: Secondary | ICD-10-CM

## 2022-10-22 DIAGNOSIS — E872 Acidosis, unspecified: Secondary | ICD-10-CM | POA: Diagnosis present

## 2022-10-22 DIAGNOSIS — I10 Essential (primary) hypertension: Secondary | ICD-10-CM | POA: Diagnosis present

## 2022-10-22 DIAGNOSIS — L03116 Cellulitis of left lower limb: Secondary | ICD-10-CM | POA: Diagnosis present

## 2022-10-22 DIAGNOSIS — R6520 Severe sepsis without septic shock: Secondary | ICD-10-CM | POA: Diagnosis present

## 2022-10-22 DIAGNOSIS — J4489 Other specified chronic obstructive pulmonary disease: Secondary | ICD-10-CM | POA: Diagnosis present

## 2022-10-22 DIAGNOSIS — B372 Candidiasis of skin and nail: Secondary | ICD-10-CM | POA: Diagnosis present

## 2022-10-22 DIAGNOSIS — Z803 Family history of malignant neoplasm of breast: Secondary | ICD-10-CM

## 2022-10-22 LAB — RESP PANEL BY RT-PCR (RSV, FLU A&B, COVID)  RVPGX2
Influenza A by PCR: NEGATIVE
Influenza B by PCR: NEGATIVE
Resp Syncytial Virus by PCR: NEGATIVE
SARS Coronavirus 2 by RT PCR: NEGATIVE

## 2022-10-22 LAB — CBC WITH DIFFERENTIAL/PLATELET
Abs Immature Granulocytes: 0.13 10*3/uL — ABNORMAL HIGH (ref 0.00–0.07)
Basophils Absolute: 0.1 10*3/uL (ref 0.0–0.1)
Basophils Relative: 0 %
Eosinophils Absolute: 0.2 10*3/uL (ref 0.0–0.5)
Eosinophils Relative: 1 %
HCT: 37.3 % (ref 36.0–46.0)
Hemoglobin: 12.2 g/dL (ref 12.0–15.0)
Immature Granulocytes: 1 %
Lymphocytes Relative: 9 %
Lymphs Abs: 1.8 10*3/uL (ref 0.7–4.0)
MCH: 31 pg (ref 26.0–34.0)
MCHC: 32.7 g/dL (ref 30.0–36.0)
MCV: 94.9 fL (ref 80.0–100.0)
Monocytes Absolute: 1.1 10*3/uL — ABNORMAL HIGH (ref 0.1–1.0)
Monocytes Relative: 5 %
Neutro Abs: 17.6 10*3/uL — ABNORMAL HIGH (ref 1.7–7.7)
Neutrophils Relative %: 84 %
Platelets: 143 10*3/uL — ABNORMAL LOW (ref 150–400)
RBC: 3.93 MIL/uL (ref 3.87–5.11)
RDW: 14.6 % (ref 11.5–15.5)
WBC: 21 10*3/uL — ABNORMAL HIGH (ref 4.0–10.5)
nRBC: 0 % (ref 0.0–0.2)

## 2022-10-22 LAB — PROTIME-INR
INR: 1.3 — ABNORMAL HIGH (ref 0.8–1.2)
Prothrombin Time: 16.4 seconds — ABNORMAL HIGH (ref 11.4–15.2)

## 2022-10-22 LAB — URINALYSIS, ROUTINE W REFLEX MICROSCOPIC
Bilirubin Urine: NEGATIVE
Glucose, UA: NEGATIVE mg/dL
Ketones, ur: NEGATIVE mg/dL
Leukocytes,Ua: NEGATIVE
Nitrite: NEGATIVE
Protein, ur: 100 mg/dL — AB
Specific Gravity, Urine: 1.025 (ref 1.005–1.030)
pH: 5 (ref 5.0–8.0)

## 2022-10-22 LAB — COMPREHENSIVE METABOLIC PANEL
ALT: 18 U/L (ref 0–44)
AST: 34 U/L (ref 15–41)
Albumin: 3.2 g/dL — ABNORMAL LOW (ref 3.5–5.0)
Alkaline Phosphatase: 86 U/L (ref 38–126)
Anion gap: 9 (ref 5–15)
BUN: 17 mg/dL (ref 6–20)
CO2: 25 mmol/L (ref 22–32)
Calcium: 8.7 mg/dL — ABNORMAL LOW (ref 8.9–10.3)
Chloride: 97 mmol/L — ABNORMAL LOW (ref 98–111)
Creatinine, Ser: 1.25 mg/dL — ABNORMAL HIGH (ref 0.44–1.00)
GFR, Estimated: 50 mL/min — ABNORMAL LOW (ref >60)
Glucose, Bld: 153 mg/dL — ABNORMAL HIGH (ref 70–99)
Potassium: 4.9 mmol/L (ref 3.5–5.1)
Sodium: 131 mmol/L — ABNORMAL LOW (ref 135–145)
Total Bilirubin: 1.5 mg/dL — ABNORMAL HIGH (ref 0.3–1.2)
Total Protein: 8 g/dL (ref 6.5–8.1)

## 2022-10-22 LAB — APTT: aPTT: 39 seconds — ABNORMAL HIGH (ref 24–36)

## 2022-10-22 LAB — LACTIC ACID, PLASMA
Lactic Acid, Venous: 1.9 mmol/L (ref 0.5–1.9)
Lactic Acid, Venous: 1.5 mmol/L (ref 0.5–1.9)

## 2022-10-22 LAB — GLUCOSE, CAPILLARY: Glucose-Capillary: 127 mg/dL — ABNORMAL HIGH (ref 70–99)

## 2022-10-22 MED ORDER — DOCUSATE SODIUM 100 MG PO CAPS
100.0000 mg | ORAL_CAPSULE | Freq: Two times a day (BID) | ORAL | Status: DC
  Administered 2022-10-22 – 2022-11-02 (×22): 100 mg via ORAL
  Filled 2022-10-22 (×22): qty 1

## 2022-10-22 MED ORDER — IPRATROPIUM-ALBUTEROL 0.5-2.5 (3) MG/3ML IN SOLN: 3.0000 mL | Freq: Four times a day (QID) | RESPIRATORY_TRACT | Status: DC

## 2022-10-22 MED ORDER — KETOROLAC TROMETHAMINE 15 MG/ML IJ SOLN
15.0000 mg | Freq: Four times a day (QID) | INTRAMUSCULAR | Status: DC | PRN
  Administered 2022-10-23: 15 mg via INTRAVENOUS
  Filled 2022-10-22: qty 1

## 2022-10-22 MED ORDER — ZINC OXIDE 40 % EX OINT
1.0000 | TOPICAL_OINTMENT | Freq: Every morning | CUTANEOUS | Status: DC
  Administered 2022-10-23 – 2022-11-01 (×9): 1 via TOPICAL
  Filled 2022-10-22 (×2): qty 113

## 2022-10-22 MED ORDER — FLUCONAZOLE 100 MG PO TABS
100.0000 mg | ORAL_TABLET | Freq: Every day | ORAL | Status: DC
  Administered 2022-10-23 – 2022-10-29 (×7): 100 mg via ORAL
  Filled 2022-10-22 (×9): qty 1

## 2022-10-22 MED ORDER — FLUCONAZOLE 50 MG PO TABS
150.0000 mg | ORAL_TABLET | Freq: Once | ORAL | Status: AC
  Administered 2022-10-22: 150 mg via ORAL
  Filled 2022-10-22: qty 1

## 2022-10-22 MED ORDER — OXYBUTYNIN CHLORIDE 5 MG PO TABS
5.0000 mg | ORAL_TABLET | Freq: Two times a day (BID) | ORAL | Status: DC
  Administered 2022-10-22 – 2022-11-02 (×22): 5 mg via ORAL
  Filled 2022-10-22 (×22): qty 1

## 2022-10-22 MED ORDER — PANTOPRAZOLE SODIUM 40 MG PO TBEC
40.0000 mg | DELAYED_RELEASE_TABLET | Freq: Every day | ORAL | Status: DC
  Administered 2022-10-23 – 2022-11-02 (×11): 40 mg via ORAL
  Filled 2022-10-22 (×11): qty 1

## 2022-10-22 MED ORDER — VANCOMYCIN HCL IN DEXTROSE 1-5 GM/200ML-% IV SOLN
1000.0000 mg | INTRAVENOUS | Status: DC
  Administered 2022-10-23: 1000 mg via INTRAVENOUS
  Filled 2022-10-22 (×2): qty 200

## 2022-10-22 MED ORDER — BUPROPION HCL 100 MG PO TABS
100.0000 mg | ORAL_TABLET | Freq: Two times a day (BID) | ORAL | Status: DC
  Administered 2022-10-22 – 2022-11-02 (×22): 100 mg via ORAL
  Filled 2022-10-22 (×24): qty 1

## 2022-10-22 MED ORDER — SODIUM CHLORIDE 0.9 % IV SOLN
2.0000 g | Freq: Once | INTRAVENOUS | Status: AC
  Administered 2022-10-22: 2 g via INTRAVENOUS
  Filled 2022-10-22: qty 12.5

## 2022-10-22 MED ORDER — HYDROCODONE-ACETAMINOPHEN 5-325 MG PO TABS
1.0000 | ORAL_TABLET | ORAL | Status: DC | PRN
  Administered 2022-10-23 – 2022-10-26 (×10): 2 via ORAL
  Administered 2022-10-27 (×2): 1 via ORAL
  Administered 2022-10-28 – 2022-10-30 (×2): 2 via ORAL
  Filled 2022-10-22 (×2): qty 2
  Filled 2022-10-22: qty 1
  Filled 2022-10-22 (×4): qty 2
  Filled 2022-10-22: qty 1
  Filled 2022-10-22 (×6): qty 2

## 2022-10-22 MED ORDER — MAGNESIUM OXIDE -MG SUPPLEMENT 400 (240 MG) MG PO TABS
400.0000 mg | ORAL_TABLET | Freq: Two times a day (BID) | ORAL | Status: DC
  Administered 2022-10-22 – 2022-11-02 (×22): 400 mg via ORAL
  Filled 2022-10-22 (×35): qty 1

## 2022-10-22 MED ORDER — FERROUS SULFATE 325 (65 FE) MG PO TABS
325.0000 mg | ORAL_TABLET | Freq: Two times a day (BID) | ORAL | Status: DC
  Administered 2022-10-22 – 2022-11-02 (×22): 325 mg via ORAL
  Filled 2022-10-22 (×22): qty 1

## 2022-10-22 MED ORDER — VANCOMYCIN HCL 2000 MG/400ML IV SOLN
2000.0000 mg | Freq: Once | INTRAVENOUS | Status: AC
  Administered 2022-10-22: 2000 mg via INTRAVENOUS
  Filled 2022-10-22: qty 400

## 2022-10-22 MED ORDER — CALCIUM CARBONATE 1250 (500 CA) MG PO TABS
1.0000 | ORAL_TABLET | Freq: Two times a day (BID) | ORAL | Status: DC
  Administered 2022-10-23 – 2022-11-02 (×20): 1250 mg via ORAL
  Filled 2022-10-22 (×23): qty 1

## 2022-10-22 MED ORDER — VANCOMYCIN HCL 500 MG/100ML IV SOLN
500.0000 mg | Freq: Once | INTRAVENOUS | Status: AC
  Administered 2022-10-23: 500 mg via INTRAVENOUS
  Filled 2022-10-22 (×2): qty 100

## 2022-10-22 MED ORDER — ACETAMINOPHEN 500 MG PO TABS
1000.0000 mg | ORAL_TABLET | Freq: Once | ORAL | Status: AC
  Administered 2022-10-22: 1000 mg via ORAL
  Filled 2022-10-22: qty 2

## 2022-10-22 MED ORDER — ACETAMINOPHEN 325 MG PO TABS
650.0000 mg | ORAL_TABLET | Freq: Four times a day (QID) | ORAL | Status: DC | PRN
  Administered 2022-10-23 – 2022-10-31 (×2): 650 mg via ORAL
  Filled 2022-10-22 (×2): qty 2

## 2022-10-22 MED ORDER — METRONIDAZOLE 500 MG/100ML IV SOLN
500.0000 mg | Freq: Two times a day (BID) | INTRAVENOUS | Status: DC
  Administered 2022-10-23 – 2022-10-26 (×7): 500 mg via INTRAVENOUS
  Filled 2022-10-22 (×7): qty 100

## 2022-10-22 MED ORDER — METRONIDAZOLE 500 MG/100ML IV SOLN
500.0000 mg | Freq: Once | INTRAVENOUS | Status: AC
  Administered 2022-10-22: 500 mg via INTRAVENOUS
  Filled 2022-10-22: qty 100

## 2022-10-22 MED ORDER — IPRATROPIUM-ALBUTEROL 20-100 MCG/ACT IN AERS: 1.0000 | INHALATION_SPRAY | Freq: Four times a day (QID) | RESPIRATORY_TRACT | Status: DC

## 2022-10-22 MED ORDER — ONDANSETRON HCL 4 MG/2ML IJ SOLN: 4.0000 mg | Freq: Four times a day (QID) | INTRAMUSCULAR | Status: DC | PRN

## 2022-10-22 MED ORDER — IPRATROPIUM-ALBUTEROL 0.5-2.5 (3) MG/3ML IN SOLN
3.0000 mL | Freq: Four times a day (QID) | RESPIRATORY_TRACT | Status: DC
  Administered 2022-10-23 (×4): 3 mL via RESPIRATORY_TRACT
  Filled 2022-10-22 (×5): qty 3

## 2022-10-22 MED ORDER — LACTATED RINGERS IV SOLN
150.0000 mL/h | INTRAVENOUS | Status: DC
  Administered 2022-10-22 – 2022-10-23 (×2): 150 mL/h via INTRAVENOUS

## 2022-10-22 MED ORDER — LATANOPROST 0.005 % OP SOLN
1.0000 [drp] | Freq: Every day | OPHTHALMIC | Status: DC
  Administered 2022-10-22 – 2022-11-01 (×11): 1 [drp] via OPHTHALMIC
  Filled 2022-10-22: qty 2.5

## 2022-10-22 MED ORDER — MORPHINE SULFATE (PF) 4 MG/ML IV SOLN
4.0000 mg | Freq: Once | INTRAVENOUS | Status: AC
  Administered 2022-10-22: 4 mg via INTRAVENOUS
  Filled 2022-10-22: qty 1

## 2022-10-22 MED ORDER — ALBUTEROL SULFATE (2.5 MG/3ML) 0.083% IN NEBU: 2.5000 mg | INHALATION_SOLUTION | RESPIRATORY_TRACT | Status: DC | PRN

## 2022-10-22 MED ORDER — INSULIN ASPART 100 UNIT/ML IJ SOLN
0.0000 [IU] | Freq: Three times a day (TID) | INTRAMUSCULAR | Status: DC
  Administered 2022-10-23 – 2022-10-28 (×5): 3 [IU] via SUBCUTANEOUS
  Administered 2022-10-29 – 2022-10-30 (×2): 4 [IU] via SUBCUTANEOUS
  Administered 2022-10-31 – 2022-11-01 (×3): 3 [IU] via SUBCUTANEOUS
  Administered 2022-11-02: 4 [IU] via SUBCUTANEOUS
  Filled 2022-10-22 (×12): qty 1

## 2022-10-22 MED ORDER — INSULIN ASPART 100 UNIT/ML IJ SOLN: 0.0000 [IU] | Freq: Every day | INTRAMUSCULAR | Status: DC

## 2022-10-22 MED ORDER — ENOXAPARIN SODIUM 60 MG/0.6ML IJ SOSY
0.5000 mg/kg | PREFILLED_SYRINGE | INTRAMUSCULAR | Status: DC
  Administered 2022-10-22 – 2022-11-01 (×11): 57.5 mg via SUBCUTANEOUS
  Filled 2022-10-22 (×11): qty 0.6

## 2022-10-22 MED ORDER — VANCOMYCIN HCL IN DEXTROSE 1-5 GM/200ML-% IV SOLN: 1000.0000 mg | Freq: Once | INTRAVENOUS | Status: DC

## 2022-10-22 MED ORDER — SODIUM CHLORIDE 0.9 % IV SOLN
2.0000 g | Freq: Two times a day (BID) | INTRAVENOUS | Status: DC
  Administered 2022-10-23: 2 g via INTRAVENOUS
  Filled 2022-10-22: qty 12.5

## 2022-10-22 MED ORDER — ACETAMINOPHEN 650 MG RE SUPP: 650.0000 mg | Freq: Four times a day (QID) | RECTAL | Status: DC | PRN

## 2022-10-22 MED ORDER — GABAPENTIN 300 MG PO CAPS
300.0000 mg | ORAL_CAPSULE | Freq: Two times a day (BID) | ORAL | Status: DC
  Administered 2022-10-22 – 2022-11-02 (×22): 300 mg via ORAL
  Filled 2022-10-22 (×22): qty 1

## 2022-10-22 MED ORDER — MORPHINE SULFATE (PF) 2 MG/ML IV SOLN
2.0000 mg | INTRAVENOUS | Status: DC | PRN
  Administered 2022-10-22 – 2022-10-27 (×2): 2 mg via INTRAVENOUS
  Filled 2022-10-22 (×2): qty 1

## 2022-10-22 MED ORDER — SERTRALINE HCL 50 MG PO TABS
25.0000 mg | ORAL_TABLET | Freq: Every day | ORAL | Status: DC
  Administered 2022-10-23 – 2022-11-02 (×11): 25 mg via ORAL
  Filled 2022-10-22 (×11): qty 1

## 2022-10-22 MED ORDER — ONDANSETRON HCL 4 MG PO TABS
4.0000 mg | ORAL_TABLET | Freq: Four times a day (QID) | ORAL | Status: DC | PRN
  Administered 2022-10-24 – 2022-10-26 (×3): 4 mg via ORAL
  Filled 2022-10-22 (×3): qty 1

## 2022-10-22 MED ORDER — ONDANSETRON HCL 4 MG/2ML IJ SOLN
4.0000 mg | Freq: Once | INTRAMUSCULAR | Status: AC
  Administered 2022-10-22: 4 mg via INTRAVENOUS
  Filled 2022-10-22: qty 2

## 2022-10-22 MED ORDER — LACTATED RINGERS IV BOLUS (SEPSIS)
1000.0000 mL | Freq: Once | INTRAVENOUS | Status: AC
Start: 2022-10-22 — End: 2022-10-22
  Administered 2022-10-22: 1000 mL via INTRAVENOUS

## 2022-10-22 NOTE — Sepsis Progress Note (Signed)
Elink following code sepsis °

## 2022-10-22 NOTE — Consult Note (Signed)
Pharmacy Antibiotic Note  Caitlin Hardin is a 57 y.o. female admitted on 10/22/2022 with cellulitis.  Pharmacy has been consulted for vancomycin and cefepime dosing.  Vancomycin 2 grams IV x 1 given 3/31 @ 1933  Plan: Give additional vancomycin 500 mg IV x 1 to complete 2500 mg loading dose, then start 1000 mg IV every 24 hours Goal AUC 400-550 Estimated AUC 529.9, Cmin 15.1 Wt 116.1 kg, Scr 1.25, Vd coefficient 0.5  Vancomycin levels at steady state or as clinically indicated. Start cefepime 2 grams IV every 12 hours Flagyl 500 mg IV every 12 hours ordered by provider Follow renal function and cultures for adjustments   Height: 4\' 11"  (149.9 cm) Weight: 116.1 kg (255 lb 15.3 oz) IBW/kg (Calculated) : 43.2  Temp (24hrs), Avg:101.8 F (38.8 C), Min:100.4 F (38 C), Max:103.1 F (39.5 C)  Recent Labs  Lab 10/22/22 1831 10/22/22 1937  WBC 21.0*  --   CREATININE 1.25*  --   LATICACIDVEN 1.9 1.5    Estimated Creatinine Clearance: 56.8 mL/min (A) (by C-G formula based on SCr of 1.25 mg/dL (H)).    No Known Allergies  Antimicrobials this admission: Vancomycin 3/31 >>  cefepime 3/31 >>  Flagyl 3/31 >>  Dose adjustments this admission: N/A  Microbiology results: 3/31 BCx: pending 3/31 UCx: pending   Thank you for allowing pharmacy to be a part of this patient's care.  Lorin Picket, PharmD 10/22/2022 9:25 PM

## 2022-10-22 NOTE — Assessment & Plan Note (Signed)
Complicating factor to overall prognosis and care 

## 2022-10-22 NOTE — Assessment & Plan Note (Signed)
Sliding scale insulin coverage 

## 2022-10-22 NOTE — ED Notes (Signed)
Lab called to attempt second set of blood cultures on pt

## 2022-10-22 NOTE — Assessment & Plan Note (Signed)
Continue Zoloft and bupropion

## 2022-10-22 NOTE — H&P (Signed)
History and Physical    Patient: Caitlin Hardin [id number] DOB: [birth year] DOA: 10/22/2022 DOS: the patient was seen and examined on 10/22/2022 PCP: Verl Blalock, NP  Patient coming from: Home  Chief Complaint:  Chief Complaint  Patient presents with   Fever    Patient presents with LEFT lower leg redness, pain, and hot to touch (appears to be cellulitic); Patient did have a mechanical fall this morning around 05:00 when she was walking to the restroom; Ever since that fall, she had progressively worsening LEFT lower leg pain; Patient is febrile with temp of 103.1 (Tylenol 1,000 mg given); Sepsis work-up initiated   Fall    HPI: Caitlin Hardin is a 57 y.o. female with medical history significant for Class III obesity, BMI over 50, OSA on CPAP, HTN, DM, prior stroke, depression, COPD on home O2 at 2 L, CKD llla who presents to the ED after awakening with left lower extremity pain redness and swelling, which was noticed after sustaining a mechanical fall after waking at 5 AM and going to the bathroom.  She later developed a fever on her way to the hospital.  She denies cough or shortness of breath, vomiting or diarrhea and denies chest pain. ED course and data review: Tmax 103.1 with pulse 112 and respirations 23, O2 sat 95% on room air and BP 115/60.  Labs notable for WBC 21,000 with lactic acid 1.5.  Respiratory viral panel negative.CMP notable for creatinine 1.25 which is about her baseline and minor electrolyte abnormalities.  Total bilirubin 1.5.  Urinalysis mostly unremarkable. EKG, personally viewed and interpreted showing sinus tachycardia at 110 with no acute ST-T wave changes. Chest x-ray showed no focal consolidations Left lower extremity Doppler without evidence of DVT Patient was treated with sepsis fluids, started on cefepime and metronidazole and vancomycin. She was also given fluconazole for candidal rash in skin folds.  Hospitalist consulted for admission.   Review  of Systems: As mentioned in the history of present illness. All other systems reviewed and are negative.  Past Medical History:  Diagnosis Date   Asthma    COPD (chronic obstructive pulmonary disease) (HCC)    GERD (gastroesophageal reflux disease)    Gout    Hypertension    OAB (overactive bladder)    Polyneuropathy    Sleep apnea    CPAP   Stroke ([place])    Type 2 diabetes mellitus ([place])    Past Surgical History:  Procedure Laterality Date   CATARACT EXTRACTION W/PHACO Right 01/04/2022   Procedure: CATARACT EXTRACTION PHACO AND INTRAOCULAR LENS PLACEMENT ([place]) RIGHT DIABETIC;  Surgeon: Leandrew Koyanagi, MD;  Location: [facility];  Service: Ophthalmology;  Laterality: Right;  2.12 00:20.8   CATARACT EXTRACTION W/PHACO Left 01/18/2022   Procedure: CATARACT EXTRACTION PHACO AND INTRAOCULAR LENS PLACEMENT ([place]) LEFT DIABETIC 1.15 00:20.5;  Surgeon: Leandrew Koyanagi, MD;  Location: [facility];  Service: Ophthalmology;  Laterality: Left;   MOUTH SURGERY     gums   Social History:  reports that she has never smoked. She has never used smokeless tobacco. She reports that she does not drink alcohol and does not use drugs.  No Known Allergies  Family History  Problem Relation Age of Onset   Breast cancer Paternal Grandmother     Prior to Admission medications   Medication Sig Start Date End Date Taking? Authorizing Provider  acetaminophen (TYLENOL) 325 MG tablet Take 2 tablets by mouth every 6 (six) hours as needed. 05/31/17  Yes [provider]  allopurinol (ZYLOPRIM) 100 MG tablet Take 100 mg by mouth 2 (two) times daily. 08/15/18  Yes [provider]  amLODipine (NORVASC) 5 MG tablet Take 5 mg by mouth daily.   Yes [provider]  buPROPion (WELLBUTRIN) 100 MG tablet Take 100 mg by mouth 2 (two) times daily. 08/15/18  Yes [provider]  calcium carbonate (OS-CAL - DOSED IN MG OF ELEMENTAL CALCIUM) 1250 (500 Ca) MG tablet  Take 1 tablet by mouth 2 (two) times daily. 10/07/20  Yes [provider]  cholecalciferol (VITAMIN D) 25 MCG (1000 UT) tablet Take 1 tablet by mouth daily. 05/24/17  Yes [provider]  cloNIDine (CATAPRES) 0.2 MG tablet Take 1 tablet by mouth 3 (three) times daily. 08/15/18  Yes [provider]  COMBIVENT RESPIMAT 20-100 MCG/ACT AERS respimat Inhale 1 puff into the lungs 4 (four) times daily. 07/26/18  Yes [provider]  diclofenac Sodium (VOLTAREN) 1 % GEL Apply 2 g topically 4 (four) times daily. To both knees and hips. 03/23/21  Yes [provider]  Docusate Sodium 100 MG capsule Take 100 mg by mouth 2 (two) times daily.   Yes [provider]  FEROSUL 325 (65 Fe) MG tablet Take 325 mg by mouth 2 (two) times daily. 12/07/20  Yes [provider]  fluticasone (FLONASE) 50 MCG/ACT nasal spray Place 2 sprays into both nostrils daily. 08/02/18  Yes [provider]  folic acid (FOLVITE) 1 MG tablet Take 1 mg by mouth daily.   Yes [provider]  gabapentin (NEURONTIN) 300 MG capsule Take 300 mg by mouth 2 (two) times daily. Take at 1400 and 2000 08/15/18  Yes [provider]  latanoprost (XALATAN) 0.005 % ophthalmic solution Place 1 drop into both eyes at bedtime. 03/03/21  Yes [provider]  magnesium oxide (MAG-OX) 400 MG tablet Take 400 mg by mouth 2 (two) times daily. 05/24/17  Yes [provider]  metFORMIN (GLUCOPHAGE) 500 MG tablet Take 1.5 tablets by mouth 2 (two) times daily. 08/15/18  Yes [provider]  neomycin-bacitracin-polymyxin [phone] OINT Apply 1 Application topically daily.   Yes [provider]  omeprazole (PRILOSEC) 40 MG capsule Take 40 mg by mouth daily. 09/29/22  Yes [provider]  oxybutynin (DITROPAN) 5 MG tablet Take 1 tablet by mouth 2 (two) times daily.   Yes [provider]  sertraline (ZOLOFT) 50 MG tablet Take 25 mg by mouth  daily. 10/07/20  Yes [provider]  zinc oxide 20 % ointment Apply 1 Application topically every morning. 10/12/22  Yes [provider]  aspirin 81 MG chewable tablet Chew 81 mg by mouth daily. Patient not taking: Reported on 10/22/2022 12/07/20   [provider]  azelastine (ASTELIN) 0.1 % nasal spray Place 1 spray into both nostrils daily. Patient not taking: Reported on 10/22/2022 08/15/18   [provider]  azelastine (OPTIVAR) 0.05 % ophthalmic solution Place 1 drop into both eyes daily. Patient not taking: Reported on 10/22/2022 08/28/18   [provider]  cetirizine (ZYRTEC) 10 MG tablet Take 10 mg by mouth daily. Patient not taking: Reported on 10/22/2022 08/18/16   [provider]  FLUoxetine (PROZAC) 40 MG capsule Take 40 mg by mouth daily. Patient not taking: Reported on 10/22/2022    [provider]  loratadine (CLARITIN) 10 MG tablet Take 10 mg by mouth daily. Patient not taking: Reported on 10/22/2022 10/07/20   [provider]  meclizine (ANTIVERT) 25  MG tablet Take 25 mg by mouth 3 (three) times daily as needed for dizziness. Patient not taking: Reported on 10/22/2022    [provider]  OXYGEN Inhale 2 L into the lungs as needed.    [provider]  Zinc Oxide 13 % CREA Apply 1 application topically 2 (two) times daily. Patient not taking: Reported on 10/22/2022    [provider]    Physical Exam: Vitals:   10/22/22 1915 10/22/22 1945 10/22/22 2000 10/22/22 2005  BP:   (!) 105/52   Pulse: 99 95 97   Resp: 18  16   Temp:    (!) 100.4 F (38 C)  TempSrc:    Oral  SpO2: 95% 97% 96%   Weight:      Height:       Physical Exam Vitals and nursing note reviewed.  Constitutional:      General: She is not in acute distress. HENT:     Head: Normocephalic and atraumatic.  Cardiovascular:     Rate and Rhythm: Normal rate and regular rhythm.     Heart sounds: Normal heart sounds.   Pulmonary:     Effort: Pulmonary effort is normal.     Breath sounds: Normal breath sounds.  Abdominal:     Palpations: Abdomen is soft.     Tenderness: There is no abdominal tenderness.  Musculoskeletal:     Comments: See pic below  Neurological:     Mental Status: Mental status is at baseline.     Labs on Admission: I have personally reviewed following labs and imaging studies  CBC: Recent Labs  Lab 10/22/22 1831  WBC 21.0*  NEUTROABS 17.6*  HGB 12.2  HCT 37.3  MCV 94.9  PLT [id number]*   Basic Metabolic Panel: Recent Labs  Lab 10/22/22 1831  NA 131*  K 4.9  CL 97*  CO2 25  GLUCOSE 153*  BUN 17  CREATININE 1.25*  CALCIUM 8.7*   GFR: Estimated Creatinine Clearance: 56.8 mL/min (A) (by C-G formula based on SCr of 1.25 mg/dL (H)). Liver Function Tests: Recent Labs  Lab 10/22/22 1831  AST 34  ALT 18  ALKPHOS 86  BILITOT 1.5*  PROT 8.0  ALBUMIN 3.2*   No results for input(s): "LIPASE", "AMYLASE" in the last 168 hours. No results for input(s): "AMMONIA" in the last 168 hours. Coagulation Profile: Recent Labs  Lab 10/22/22 1944  INR 1.3*   Cardiac Enzymes: No results for input(s): "CKTOTAL", "CKMB", "CKMBINDEX", "TROPONINI" in the last 168 hours. BNP (last 3 results) No results for input(s): "PROBNP" in the last 8760 hours. HbA1C: No results for input(s): "HGBA1C" in the last 72 hours. CBG: No results for input(s): "GLUCAP" in the last 168 hours. Lipid Profile: No results for input(s): "CHOL", "HDL", "LDLCALC", "TRIG", "CHOLHDL", "LDLDIRECT" in the last 72 hours. Thyroid Function Tests: No results for input(s): "TSH", "T4TOTAL", "FREET4", "T3FREE", "THYROIDAB" in the last 72 hours. Anemia Panel: No results for input(s): "VITAMINB12", "FOLATE", "FERRITIN", "TIBC", "IRON", "RETICCTPCT" in the last 72 hours. Urine analysis:    Component Value Date/Time   COLORURINE YELLOW (A) 10/22/2022 1953   APPEARANCEUR HAZY (A) 10/22/2022 1953   LABSPEC 1.025  10/22/2022 1953   PHURINE 5.0 10/22/2022 1953   GLUCOSEU NEGATIVE 10/22/2022 1953   HGBUR MODERATE (A) 10/22/2022 1953   BILIRUBINUR NEGATIVE 10/22/2022 1953   KETONESUR NEGATIVE 10/22/2022 1953   PROTEINUR 100 (A) 10/22/2022 1953   NITRITE NEGATIVE 10/22/2022 1953   LEUKOCYTESUR NEGATIVE 10/22/2022 1953  Radiological Exams on Admission: US Venous Img Lower Unilateral Left  Result Date: 10/22/2022 CLINICAL DATA:  Left leg erythema and pain EXAM: Choose 2 LOWER EXTREMITY VENOUS DOPPLER ULTRASOUND TECHNIQUE: Gray-scale sonography with compression, as well as color and duplex ultrasound, were performed to evaluate the deep venous system(s) from the level of the common femoral vein through the popliteal and proximal calf veins. COMPARISON:  None Available. FINDINGS: VENOUS Normal compressibility of the common femoral, superficial femoral, and popliteal veins, as well as the visualized calf veins. Visualized portions of profunda femoral vein and great saphenous vein unremarkable. No filling defects to suggest DVT on grayscale or color Doppler imaging. Doppler waveforms show normal direction of venous flow, normal respiratory plasticity and response to augmentation. Limited views of the contralateral common femoral vein are unremarkable. OTHER Enlarged lymph node within the proximal left thigh measures 4.3 x 3.3 x 1.4 cm. Limitations: none IMPRESSION: 1. No evidence of deep venous thrombosis within the left lower extremity. 2. Enlarged left lymph node within the proximal left thigh, of uncertain significance. Electronically Signed   By: Randa Ngo M.D.   On: 10/22/2022 19:29   DG Chest Portable 1 View  Result Date: 10/22/2022 CLINICAL DATA:  Sepsis EXAM: PORTABLE CHEST 1 VIEW COMPARISON:  Chest radiograph dated 09/10/2018 FINDINGS: Normal lung volumes. No focal consolidations. No pleural effusion or pneumothorax. Enlarged cardiomediastinal silhouette is likely projectional. The visualized skeletal  structures are unremarkable. IMPRESSION: 1.  No focal consolidations. 2. Enlarged cardiomediastinal silhouette is likely projectional. Electronically Signed   By: Darrin Nipper M.D.   On: 10/22/2022 19:02     Data Reviewed: Relevant notes from primary care and specialist visits, past discharge summaries as available in EHR, including Care Everywhere. Prior diagnostic testing as pertinent to current admission diagnoses Updated medications and problem lists for reconciliation ED course, including vitals, labs, imaging, treatment and response to treatment Triage notes, nursing and pharmacy notes and ED provider's notes Notable results as noted in HPI   Assessment and Plan: Cellulitis of left lower extremity Sepsis Sepsis criteria include fever, tachypnea, soft blood pressure, leukocytosis lactic acidosis, Lower extremity Doppler negative for DVT Rocephin and vancomycin Sepsis fluids  Candidal intertrigo Continue fluconazole started in the ED, 100 mg daily for 10 days Skin protective barriers  Chronic obstructive pulmonary disease, unspecified (HCC) Chronic respiratory failure with hypoxia Not acutely exacerbated -Continue home inhalers - DuoNebs as needed   Stage 3a chronic kidney disease ([place]) Renal function at baseline  Class 3 severe obesity with body mass index (BMI) of 50.0 to 59.9 in adult [facility]) Complicating factor to overall prognosis and care  Diabetes mellitus type 2, noninsulin dependent (HCC) Sliding scale insulin coverage  Depression Continue Zoloft and bupropion  Essential hypertension Holding antihypertensives due to soft blood pressures likely related to sepsis.  Resume as appropriate  OSA (obstructive sleep apnea) CPAP nightly     DVT prophylaxis: Lovenox  Consults: none  Advance Care Planning:   Code Status: Prior   Family Communication: none  Disposition Plan: Back to previous home environment  Severity of Illness: The appropriate patient  status for this patient is INPATIENT. Inpatient status is judged to be reasonable and necessary in order to provide the required intensity of service to ensure the patient's safety. The patient's presenting symptoms, physical exam findings, and initial radiographic and laboratory data in the context of their chronic comorbidities is felt to place them at high risk for further clinical deterioration. Furthermore, it is not anticipated that the  patient will be medically stable for discharge from the hospital within 2 midnights of admission.   * I certify that at the point of admission it is my clinical judgment that the patient will require inpatient hospital care spanning beyond 2 midnights from the point of admission due to high intensity of service, high risk for further deterioration and high frequency of surveillance required.*  Author: Athena Masse, MD 10/22/2022 8:58 PM  For on call review [email or web address].

## 2022-10-22 NOTE — Assessment & Plan Note (Signed)
Sepsis Sepsis criteria include fever, tachypnea, soft blood pressure, leukocytosis lactic acidosis, Lower extremity Doppler negative for DVT Rocephin and vancomycin Sepsis fluids

## 2022-10-22 NOTE — ED Triage Notes (Signed)
Patient presents with LEFT lower leg redness, pain, and hot to touch (appears to be cellulitic); Patient did have a mechanical fall this morning around 05:00 when she was walking to the restroom; Ever since that fall, she had progressively worsening LEFT lower leg pain; Patient is febrile with temp of 103.1 (Tylenol 1,000 mg given); Sepsis work-up initiated

## 2022-10-22 NOTE — Progress Notes (Signed)
PHARMACIST - PHYSICIAN COMMUNICATION  CONCERNING:  Enoxaparin (Lovenox) for DVT Prophylaxis    RECOMMENDATION: Patient was prescribed enoxaprin 40mg  q24 hours for VTE prophylaxis.   Filed Weights   10/22/22 1830  Weight: 116.1 kg (255 lb 15.3 oz)    Body mass index is 51.7 kg/m.  Estimated Creatinine Clearance: 56.8 mL/min (A) (by C-G formula based on SCr of 1.25 mg/dL (H)).   Based on [place] patient is candidate for enoxaparin 0.5mg /kg TBW SQ every 24 hours based on BMI being >30.  DESCRIPTION: Pharmacy has adjusted enoxaparin dose per [facility] policy.  Patient is now receiving enoxaparin 57.5 mg every 24 hours    Lorin Picket, PharmD Clinical Pharmacist  10/22/2022 9:14 PM

## 2022-10-22 NOTE — Consult Note (Signed)
PHARMACY -  BRIEF ANTIBIOTIC NOTE   Pharmacy has received consult(s) for cefepime and vancomycin dosing from an ED provider.  The patient's profile has been reviewed for ht/wt/allergies/indication/available labs.    One time order(s) placed for cefepime 2 grams IV and vancomycin 2 grams IV  Further antibiotics/pharmacy consults should be ordered by admitting physician if indicated.                       Thank you, Lorin Picket 10/22/2022  6:32 PM

## 2022-10-22 NOTE — Assessment & Plan Note (Signed)
CPAP nightly

## 2022-10-22 NOTE — ED Provider Notes (Signed)
[facility] Provider Note    Event Date/Time   First MD Initiated Contact with Patient 10/22/22 1819     (approximate)  History   Chief Complaint: Fever, left leg pain  HPI  Caitlin Hardin is a 57 y.o. female with a past medical history of asthma, COPD on 2 L chronically, gastric reflux, hypertension, CVA, diabetes, presents to the emergency department for weakness and left leg pain found to have a fever en route to the hospital.  Here patient states left leg pain and redness just darted this morning.  Denies any cough congestion shortness of breath.  Denies any dysuria vomiting or diarrhea.  Patient does states she has had a blood clot previously but does not recall where, and is not currently on any blood thinners per patient.  Physical Exam   Triage Vital Signs: ED Triage Vitals  Enc Vitals Group     BP      Pulse      Resp      Temp      Temp src      SpO2      Weight      Height      Head Circumference      Peak Flow      Pain Score      Pain Loc      Pain Edu?      Excl. in [place]?     Most recent vital signs: There were no vitals filed for this visit.  General: Awake, no distress.  CV:  Good peripheral perfusion.  Regular rhythm, rate around 120 bpm Resp:  Normal effort.  Equal breath sounds bilaterally.  Abd:  No distention.  Soft, nontender.  No rebound or guarding. Other:  Moderate tenderness to palpation of the left lower extremity mostly in the calf area below the knee, erythematous concerning for possible cellulitis.  2+ DP pulse.  Patient has erythema under each breast most consistent with a Candida like infection.   ED Results / Procedures / Treatments   EKG  EKG viewed and interpreted by myself shows sinus tachycardia at 110 bpm with a narrow QRS, normal axis, normal intervals, no concerning ST changes.  RADIOLOGY  I have reviewed and interpreted the chest x-ray images.  No consolidation seen on my evaluation. Radiology is  read the x-ray as negative. Ultrasound negative for DVT.  MEDICATIONS ORDERED IN ED: Medications  acetaminophen (TYLENOL) tablet 1,000 mg (has no administration in time range)  lactated ringers bolus 1,000 mL (has no administration in time range)  ceFEPIme (MAXIPIME) 2 g in sodium chloride 0.9 % 100 mL IVPB (has no administration in time range)  metroNIDAZOLE (FLAGYL) IVPB 500 mg (has no administration in time range)  vancomycin (VANCOCIN) IVPB 1000 mg/200 mL premix (has no administration in time range)     IMPRESSION / MDM / ASSESSMENT AND PLAN / ED COURSE  I reviewed the triage vital signs and the nursing notes.  Patient's presentation is most consistent with acute presentation with potential threat to life or bodily function.  Patient presents emergency department for fever weakness left leg pain and redness concerning for cellulitis.  Patient is tachycardic and febrile meeting sepsis criteria.  We will check labs, start broad-spectrum antibiotics, send cultures and a lactate.  We will obtain a chest x-ray as a precaution.  Will also obtain a left lower extremity ultrasound to rule out DVT although exam is more consistent with cellulitis.  We will dose  Tylenol for fever IV hydrate while awaiting further results.  On exam patient does have what appears to be a Candida type rash under each breast we will dose Diflucan 150 mg.  Patient agreeable to plan of care.  Patient's lab work has resulted showing a normal urinalysis, lactic acid of 1.9, CBC does show significant leukocytosis of 21,000 otherwise no concerning findings, chemistry shows mild renal insufficiency otherwise no significant findings.  COVID/flu/RSV is negative.  Given the elevated white blood cell count with clinical exam consistent with cellulitis will admit to the hospital service for ongoing antibiotics with concern for sepsis.  Patient agreeable to plan of care.   CRITICAL CARE Performed by: Harvest Dark   Total  critical care time: 30 minutes  Critical care time was exclusive of separately billable procedures and treating other patients.  Critical care was necessary to treat or prevent imminent or life-threatening deterioration.  Critical care was time spent personally by me on the following activities: development of treatment plan with patient and/or surrogate as well as nursing, discussions with consultants, evaluation of patient's response to treatment, examination of patient, obtaining history from patient or surrogate, ordering and performing treatments and interventions, ordering and review of laboratory studies, ordering and review of radiographic studies, pulse oximetry and re-evaluation of patient's condition.   FINAL CLINICAL IMPRESSION(S) / ED DIAGNOSES   Cellulitis Sepsis    Note:  This document was prepared using Dragon voice recognition software and may include unintentional dictation errors.   Harvest Dark, MD 10/22/22 [phone]

## 2022-10-22 NOTE — Assessment & Plan Note (Signed)
Continue fluconazole started in the ED, 100 mg daily for 10 days Skin protective barriers

## 2022-10-22 NOTE — Assessment & Plan Note (Signed)
Chronic respiratory failure with hypoxia Not acutely exacerbated -Continue home inhalers - DuoNebs as needed

## 2022-10-22 NOTE — Consult Note (Signed)
CODE SEPSIS - PHARMACY COMMUNICATION  **Broad Spectrum Antibiotics should be administered within 1 hour of Sepsis diagnosis**  Time Code Sepsis Called/Page Received: 1835  Antibiotics Ordered: cefepime, vancomycin, and Flagyl  Time of 1st antibiotic administration: 1907  Additional action taken by pharmacy: [place] ,PharmD Clinical Pharmacist  10/22/2022  6:34 PM

## 2022-10-22 NOTE — Assessment & Plan Note (Signed)
Holding antihypertensives due to soft blood pressures likely related to sepsis.  Resume as appropriate

## 2022-10-22 NOTE — ED Notes (Signed)
Lab at bedside

## 2022-10-22 NOTE — Assessment & Plan Note (Signed)
Renal function at baseline 

## 2022-10-23 DIAGNOSIS — L03116 Cellulitis of left lower limb: Secondary | ICD-10-CM | POA: Diagnosis not present

## 2022-10-23 LAB — CORTISOL-AM, BLOOD: Cortisol - AM: 18.3 ug/dL (ref 6.7–22.6)

## 2022-10-23 LAB — CBC
HCT: 33.2 % — ABNORMAL LOW (ref 36.0–46.0)
Hemoglobin: 10.6 g/dL — ABNORMAL LOW (ref 12.0–15.0)
MCH: 30.7 pg (ref 26.0–34.0)
MCHC: 31.9 g/dL (ref 30.0–36.0)
MCV: 96.2 fL (ref 80.0–100.0)
Platelets: 140 10*3/uL — ABNORMAL LOW (ref 150–400)
RBC: 3.45 MIL/uL — ABNORMAL LOW (ref 3.87–5.11)
RDW: 14.6 % (ref 11.5–15.5)
WBC: 18.5 10*3/uL — ABNORMAL HIGH (ref 4.0–10.5)
nRBC: 0 % (ref 0.0–0.2)

## 2022-10-23 LAB — PHOSPHORUS: Phosphorus: 2.9 mg/dL (ref 2.5–4.6)

## 2022-10-23 LAB — PROCALCITONIN: Procalcitonin: 1.96 ng/mL

## 2022-10-23 LAB — GLUCOSE, CAPILLARY
Glucose-Capillary: 108 mg/dL — ABNORMAL HIGH (ref 70–99)
Glucose-Capillary: 100 mg/dL — ABNORMAL HIGH (ref 70–99)
Glucose-Capillary: 126 mg/dL — ABNORMAL HIGH (ref 70–99)
Glucose-Capillary: 138 mg/dL — ABNORMAL HIGH (ref 70–99)

## 2022-10-23 LAB — BASIC METABOLIC PANEL
Anion gap: 9 (ref 5–15)
BUN: 15 mg/dL (ref 6–20)
CO2: 25 mmol/L (ref 22–32)
Calcium: 8.6 mg/dL — ABNORMAL LOW (ref 8.9–10.3)
Chloride: 102 mmol/L (ref 98–111)
Creatinine, Ser: 1.08 mg/dL — ABNORMAL HIGH (ref 0.44–1.00)
GFR, Estimated: 60 mL/min — ABNORMAL LOW (ref >60)
Glucose, Bld: 128 mg/dL — ABNORMAL HIGH (ref 70–99)
Potassium: 3.7 mmol/L (ref 3.5–5.1)
Sodium: 136 mmol/L (ref 135–145)

## 2022-10-23 LAB — PROTIME-INR
INR: 1.3 — ABNORMAL HIGH (ref 0.8–1.2)
Prothrombin Time: 16.1 seconds — ABNORMAL HIGH (ref 11.4–15.2)

## 2022-10-23 LAB — HIV ANTIBODY (ROUTINE TESTING W REFLEX): HIV Screen 4th Generation wRfx: NONREACTIVE

## 2022-10-23 LAB — VITAMIN D 25 HYDROXY (VIT D DEFICIENCY, FRACTURES): Vit D, 25-Hydroxy: 25.62 ng/mL — ABNORMAL LOW (ref 30–100)

## 2022-10-23 LAB — MRSA NEXT GEN BY PCR, NASAL: MRSA by PCR Next Gen: NOT DETECTED

## 2022-10-23 LAB — MAGNESIUM: Magnesium: 1.9 mg/dL (ref 1.7–2.4)

## 2022-10-23 LAB — IRON AND TIBC
Iron: 20 ug/dL — ABNORMAL LOW (ref 28–170)
Saturation Ratios: 11 % (ref 10.4–31.8)
TIBC: 186 ug/dL — ABNORMAL LOW (ref 250–450)
UIBC: 166 ug/dL

## 2022-10-23 LAB — VITAMIN B12: Vitamin B-12: 462 pg/mL (ref 180–914)

## 2022-10-23 MED ORDER — VITAMIN D (ERGOCALCIFEROL) 1.25 MG (50000 UNIT) PO CAPS
50000.0000 [IU] | ORAL_CAPSULE | ORAL | Status: DC
  Administered 2022-10-23 – 2022-10-30 (×2): 50000 [IU] via ORAL
  Filled 2022-10-23 (×2): qty 1

## 2022-10-23 MED ORDER — SODIUM CHLORIDE 0.9 % IV SOLN
2.0000 g | Freq: Three times a day (TID) | INTRAVENOUS | Status: DC
  Administered 2022-10-23 – 2022-10-26 (×9): 2 g via INTRAVENOUS
  Filled 2022-10-23 (×2): qty 12.5
  Filled 2022-10-23 (×3): qty 2
  Filled 2022-10-23 (×2): qty 12.5
  Filled 2022-10-23: qty 2
  Filled 2022-10-23 (×2): qty 12.5

## 2022-10-23 NOTE — Consult Note (Signed)
Pharmacy Antibiotic Note  Caitlin Hardin is a 57 y.o. female with a medical history significant for Class III obesity, BMI over 50, OSA on CPAP, HTN, DM, prior stroke, depression, COPD on home O2 at 2L, CKD IIIa, who was admitted on 10/22/2022 with cellulitis. Pharmacy has been consulted for vancomycin and cefepime dosing. Metronidazole has also been ordered.   Vancomycin 2 grams IV x 1 given 3/31 @ 1933 Vancomycin 500 mg IV x 1 given 4/1 @ 0017   Plan: Continue vancomycin 1000 mg IV every 24 hours Goal AUC 400-550 Estimated AUC 456.2, Cmin 12.3 Wt 118.7 kg, Scr 1.08, Vd coefficient 0.5  Vancomycin levels at steady state or as clinically indicated. Increase to cefepime 2 grams IV every 8 hours as renal function has improved.  Flagyl 500 mg IV every 12 hours ordered by provider Follow renal function and cultures for adjustments   Height: 4\' 11"  (149.9 cm) Weight: 118.7 kg (261 lb 11.2 oz) IBW/kg (Calculated) : 43.2  Temp (24hrs), Avg:100.1 F (37.8 C), Min:98.1 F (36.7 C), Max:103.1 F (39.5 C)  Recent Labs  Lab 10/22/22 1831 10/22/22 1937 10/23/22 0413  WBC 21.0*  --  18.5*  CREATININE 1.25*  --  1.08*  LATICACIDVEN 1.9 1.5  --      Estimated Creatinine Clearance: 66.6 mL/min (A) (by C-G formula based on SCr of 1.08 mg/dL (H)).    No Known Allergies  Antimicrobials this admission: Vancomycin 3/31 >>  Cefepime 3/31 >>  Flagyl 3/31 >>  Dose adjustments this admission: N/A  Microbiology results: 3/31 BCx: NGTD 3/31 UCx: pending  3/31 MRSA PCR: negative  3/31 Respiratory panel: negative   Thank you for allowing pharmacy to be a part of this patient's care.  Si Raider, PharmD Candidate, Class of 2026  10/23/2022 7:59 AM

## 2022-10-23 NOTE — Evaluation (Signed)
Physical Therapy Evaluation Patient Details Name: Caitlin Hardin MRN: [id number] DOB: 1966/01/23 Today's Date: 10/23/2022  History of Present Illness  Pt is a 57 y.o. female with medical history significant for Class III obesity, BMI over 50, OSA on CPAP, HTN, DM, prior stroke, depression, COPD on home O2 at 2 L, CKD llla who presents to the ED after awakening with left lower extremity pain redness and swelling, which was noticed after sustaining a mechanical fall after waking at 5 AM and going to the bathroom.  She later developed a fever on her way to the hospital.  MD assessment includes: cellulitis of left lower extremity, sepsis, candidal intertrigo, and iron deficiency anemia.   Clinical Impression  Pt was pleasant and motivated to participate during the session and put forth good effort throughout. Pt required no physical assistance during the session but did require cuing and extra time and effort with functional tasks.  Pt was able to amb 15 feet x 3 with seated therapeutic rest breaks between walks and with cuing for step-to pattern to address L knee pain with WB.  Pt reported no adverse symptoms during the session other than knee pain with SpO2 and HR WNL on 2LO2/min.  Pt will benefit from continued PT services upon discharge to safely address deficits listed in patient problem list for decreased caregiver assistance and eventual return to PLOF.         Recommendations for follow up therapy are one component of a multi-disciplinary discharge planning process, led by the attending physician.  Recommendations may be updated based on patient status, additional functional criteria and insurance authorization.  Follow Up Recommendations       Assistance Recommended at Discharge Intermittent Supervision/Assistance  Patient can return home with the following  A little help with walking and/or transfers;A little help with bathing/dressing/bathroom;Assistance with cooking/housework;Direct  supervision/assist for medications management;Assist for transportation    Equipment Recommendations Rolling walker (2 wheels)  Recommendations for Other Services       Functional Status Assessment Patient has had a recent decline in their functional status and demonstrates the ability to make significant improvements in function in a reasonable and predictable amount of time.     Precautions / Restrictions Precautions Precautions: Fall Restrictions Weight Bearing Restrictions: No      Mobility  Bed Mobility Overal bed mobility: Modified Independent             General bed mobility comments: Extra time and effort only    Transfers Overall transfer level: Needs assistance Equipment used: Rolling walker (2 wheels) Transfers: Sit to/from Stand Sit to Stand: Min guard, From elevated surface           General transfer comment: Min verbal cues for hand placement    Ambulation/Gait Ambulation/Gait assistance: Min guard Gait Distance (Feet): 15 Feet x 3 Assistive device: Rolling walker (2 wheels) Gait Pattern/deviations: Step-to pattern, Antalgic, Decreased step length - right, Decreased stance time - left Gait velocity: decreased     General Gait Details: Mod multi-modal cues for step-to sequencing for LLE pain control  Stairs            Wheelchair Mobility    Modified Rankin (Stroke Patients Only)       Balance Overall balance assessment: Needs assistance   Sitting balance-Leahy Scale: Good     Standing balance support: Bilateral upper extremity supported, During functional activity Standing balance-Leahy Scale: Fair  Pertinent Vitals/Pain Pain Assessment Pain Assessment: 0-10 Pain Score: 9  Pain Location: L knee Pain Descriptors / Indicators: Aching, Sore Pain Intervention(s): Repositioned, Premedicated before session, Monitored during session    Home Living Family/patient expects to be  discharged to:: Assisted living                 Home Equipment: Conservation officer, nature (2 wheels);Wheelchair - manual;Grab bars - toilet Additional Comments: Pt has a RW that was left by a previous resident at her facility but it is not in good condition per patient; facility owns a w/c if needed    Prior Function Prior Level of Function : Needs assist;History of Falls (last six months)       Physical Assist : ADLs (physical);Mobility (physical) Mobility (physical): Gait ADLs (physical): Grooming;Bathing;Dressing Mobility Comments: SBA with amb at baseline with a RW facility distances, two falls in the last six months secondary to legs buckling ADLs Comments: Staff assists with ADLs     Hand Dominance        Extremity/Trunk Assessment   Upper Extremity Assessment Upper Extremity Assessment: Generalized weakness    Lower Extremity Assessment Lower Extremity Assessment: Generalized weakness;LLE deficits/detail LLE: Unable to fully assess due to pain       Communication   Communication: No difficulties  Cognition Arousal/Alertness: Awake/alert Behavior During Therapy: WFL for tasks assessed/performed Overall Cognitive Status: Within Functional Limits for tasks assessed                                          General Comments      Exercises     Assessment/Plan    PT Assessment Patient needs continued PT services  PT Problem List Decreased strength;Decreased activity tolerance;Decreased balance;Decreased mobility;Decreased knowledge of use of DME       PT Treatment Interventions DME instruction;Gait training;Functional mobility training;Therapeutic activities;Therapeutic exercise;Balance training;Patient/family education    PT Goals (Current goals can be found in the Care Plan section)  Acute Rehab PT Goals Patient Stated Goal: To get stronger PT Goal Formulation: With patient Time For Goal Achievement: 11/05/22 Potential to Achieve Goals:  Good    Frequency Min 2X/week     Co-evaluation               AM-PAC PT "6 Clicks" Mobility  Outcome Measure Help needed turning from your back to your side while in a flat bed without using bedrails?: A Little Help needed moving from lying on your back to sitting on the side of a flat bed without using bedrails?: A Little Help needed moving to and from a bed to a chair (including a wheelchair)?: A Little Help needed standing up from a chair using your arms (e.g., wheelchair or bedside chair)?: A Little Help needed to walk in hospital room?: A Little Help needed climbing 3-5 steps with a railing? : A Lot 6 Click Score: 17    End of Session Equipment Utilized During Treatment: Gait belt Activity Tolerance: Patient tolerated treatment well Patient left: in bed;with call bell/phone within reach;with bed alarm set Nurse Communication: Mobility status PT Visit Diagnosis: Unsteadiness on feet (R26.81);History of falling (Z91.81);Difficulty in walking, not elsewhere classified (R26.2);Muscle weakness (generalized) (M62.81);Pain Pain - Right/Left: Left Pain - part of body: Leg    Time: 1415-1444 PT Time Calculation (min) (ACUTE ONLY): 29 min   Charges:   PT Evaluation $PT Eval Moderate Complexity: 1  Mod         DRoyetta Asal PT, DPT 10/23/22, 4:05 PM

## 2022-10-23 NOTE — Progress Notes (Signed)
[facility] Progress Note  Patient: Caitlin Hardin    [id number]  DOA: 10/22/2022     Date of Service: the patient was seen and examined on 10/23/2022  Chief Complaint  Patient presents with   Fever    Patient presents with LEFT lower leg redness, pain, and hot to touch (appears to be cellulitic); Patient did have a mechanical fall this morning around 05:00 when she was walking to the restroom; Ever since that fall, she had progressively worsening LEFT lower leg pain; Patient is febrile with temp of 103.1 (Tylenol 1,000 mg given); Sepsis work-up initiated   Fall   Brief hospital course: Caitlin Hardin is a 57 y.o. female with medical history significant for Class III obesity, BMI over 50, OSA on CPAP, HTN, DM, prior stroke, depression, COPD on home O2 at 2 L, CKD llla who presents to the ED after awakening with left lower extremity pain redness and swelling, which was noticed after sustaining a mechanical fall after waking at 5 AM and going to the bathroom.  She later developed a fever on her way to the hospital.   ED w/up: Tmax 103.1 with pulse 112 and respirations 23, O2 sat 95% on room air and BP 115/60.  Labs notable for WBC 21,000 with lactic acid 1.5.  Respiratory viral panel negative.CMP notable for creatinine 1.25 which is about her baseline and minor electrolyte abnormalities.  Total bilirubin 1.5.  Urinalysis mostly unremarkable. EKG, personally viewed and interpreted showing sinus tachycardia at 110 with no acute ST-T wave changes. Chest x-ray showed no focal consolidations Left lower extremity Doppler without evidence of DVT Patient was treated with sepsis fluids, started on cefepime and metronidazole and vancomycin. She was also given fluconazole for candidal rash in skin folds.  Hospitalist consulted for admission.    Assessment and Plan: Cellulitis of left lower extremity Sepsis, criteria include fever, tachypnea, soft blood pressure, leukocytosis lactic  acidosis, Lower extremity Doppler negative for DVT Continue cefepime, Flagyl and vancomycin S/p IVF for Sepsis    Candidal intertrigo Continue fluconazole started in the ED, 100 mg daily for 10 days Skin protective barriers   Chronic obstructive pulmonary disease, unspecified (HCC) Chronic respiratory failure with hypoxia Not acutely exacerbated -Continue home inhalers - DuoNebs as needed     Stage 3a chronic kidney disease ([place]) Renal function at baseline   Class 3 severe obesity with body mass index (BMI) of 50.0 to 123456 in adult  Complicating factor to overall prognosis and care   Diabetes mellitus type 2, noninsulin dependent (HCC) Sliding scale insulin coverage   Depression Continue Zoloft and bupropion   Essential hypertension Holding antihypertensives due to soft blood pressures likely related to sepsis.  Resume as appropriate   OSA (obstructive sleep apnea) CPAP nightly   Iron deficiency anemia, iron level 20, transferrin saturation 7% at lower end,  Continue oral iron supplement   Vitamin D insufficiency, vitamin D level 25, started oral supplement.   Body mass index is 52.86 kg/m.  Interventions:       Diet: Heart healthy/diabetic diet DVT Prophylaxis: Subcutaneous Lovenox   Advance goals of care discussion: Full code  Family Communication: family was not present at bedside, at the time of interview.  The pt provided permission to discuss medical plan with the family. Opportunity was given to ask question and all questions were answered satisfactorily.   Disposition:  Pt is from Home, admitted with sepsis due to LLE cellulitis, still on IV Abx, which precludes a safe  discharge. Discharge to Home TBD after PT/OT eval, when clinically stable.  May need few days to improve.  Subjective: No significant events overnight, patient still complaining of left lower extremity pain and tenderness, denies any worsening of shortness of breath, no chest pain or  palpitation, no any other active issues.   Physical Exam: General: NAD, lying comfortably Appear in no distress, affect appropriate Eyes: PERRLA ENT: Oral Mucosa Clear, moist  Neck: no JVD,  Cardiovascular: S1 and S2 Present, no Murmur,  Respiratory: good respiratory effort, Bilateral Air entry equal and Decreased, no Crackles, no wheezes Abdomen: Bowel Sound present, Soft and no tenderness,  Skin: no rashes Extremities: LLE mild erythema and tenderness, mild edema. no calf tenderness Neurologic: without any new focal findings Gait not checked due to patient safety concerns  Vitals:   10/23/22 0401 10/23/22 0501 10/23/22 0600 10/23/22 0745  BP: 129/67 (!) 110/52 (!) 117/56 118/62  Pulse: 78 87 83 84  Resp:    16  Temp:    98.7 F (37.1 C)  TempSrc:    Oral  SpO2: 100% 100% 100% 100%  Weight:      Height:        Intake/Output Summary (Last 24 hours) at 10/23/2022 1348 Last data filed at 10/23/2022 0700 Gross per 24 hour  Intake 1012.86 ml  Output --  Net 1012.86 ml   Filed Weights   10/22/22 1830 10/22/22 2212  Weight: 116.1 kg 118.7 kg    Data Reviewed: I have personally reviewed and interpreted daily labs, tele strips, imagings as discussed above. I reviewed all nursing notes, pharmacy notes, vitals, pertinent old records I have discussed plan of care as described above with RN and patient/family.  CBC: Recent Labs  Lab 10/22/22 1831 10/23/22 0413  WBC 21.0* 18.5*  NEUTROABS 17.6*  --   HGB 12.2 10.6*  HCT 37.3 33.2*  MCV 94.9 96.2  PLT 143* [id number]*   Basic Metabolic Panel: Recent Labs  Lab 10/22/22 1831 10/23/22 0407 10/23/22 0413  NA 131*  --  136  K 4.9  --  3.7  CL 97*  --  102  CO2 25  --  25  GLUCOSE 153*  --  128*  BUN 17  --  15  CREATININE 1.25*  --  1.08*  CALCIUM 8.7*  --  8.6*  MG  --  1.9  --   PHOS  --  2.9  --     Studies: US Venous Img Lower Unilateral Left  Result Date: 10/22/2022 CLINICAL DATA:  Left leg erythema and pain  EXAM: Choose 2 LOWER EXTREMITY VENOUS DOPPLER ULTRASOUND TECHNIQUE: Gray-scale sonography with compression, as well as color and duplex ultrasound, were performed to evaluate the deep venous system(s) from the level of the common femoral vein through the popliteal and proximal calf veins. COMPARISON:  None Available. FINDINGS: VENOUS Normal compressibility of the common femoral, superficial femoral, and popliteal veins, as well as the visualized calf veins. Visualized portions of profunda femoral vein and great saphenous vein unremarkable. No filling defects to suggest DVT on grayscale or color Doppler imaging. Doppler waveforms show normal direction of venous flow, normal respiratory plasticity and response to augmentation. Limited views of the contralateral common femoral vein are unremarkable. OTHER Enlarged lymph node within the proximal left thigh measures 4.3 x 3.3 x 1.4 cm. Limitations: none IMPRESSION: 1. No evidence of deep venous thrombosis within the left lower extremity. 2. Enlarged left lymph node within the proximal left thigh, of uncertain significance.  Electronically Signed   By: Randa Ngo M.D.   On: 10/22/2022 19:29   DG Chest Portable 1 View  Result Date: 10/22/2022 CLINICAL DATA:  Sepsis EXAM: PORTABLE CHEST 1 VIEW COMPARISON:  Chest radiograph dated 09/10/2018 FINDINGS: Normal lung volumes. No focal consolidations. No pleural effusion or pneumothorax. Enlarged cardiomediastinal silhouette is likely projectional. The visualized skeletal structures are unremarkable. IMPRESSION: 1.  No focal consolidations. 2. Enlarged cardiomediastinal silhouette is likely projectional. Electronically Signed   By: Darrin Nipper M.D.   On: 10/22/2022 19:02    Scheduled Meds:  buPROPion  100 mg Oral BID   calcium carbonate  1 tablet Oral BID WC   docusate sodium  100 mg Oral BID   enoxaparin (LOVENOX) injection  0.5 mg/kg Subcutaneous Q24H   ferrous sulfate  325 mg Oral BID   fluconazole  100 mg Oral  Daily   gabapentin  300 mg Oral BID   insulin aspart  0-20 Units Subcutaneous TID WC   insulin aspart  0-5 Units Subcutaneous QHS   ipratropium-albuterol  3 mL Nebulization QID   latanoprost  1 drop Both Eyes QHS   liver oil-zinc oxide  1 Application Topical q morning   magnesium oxide  400 mg Oral BID   oxybutynin  5 mg Oral BID   pantoprazole  40 mg Oral Daily   sertraline  25 mg Oral Daily   Continuous Infusions:  ceFEPime (MAXIPIME) IV     lactated ringers 150 mL/hr (10/23/22 0618)   metronidazole 500 mg (10/23/22 0733)   vancomycin     PRN Meds: acetaminophen **OR** acetaminophen, albuterol, HYDROcodone-acetaminophen, ketorolac, morphine injection, ondansetron **OR** ondansetron (ZOFRAN) IV  Time spent: 35 minutes  Author: Val Riles. MD [facility] 10/23/2022 1:48 PM  To reach On-call, see care teams to locate the attending and reach out to them via [email or web address]. If 7PM-7AM, please contact night-coverage If you still have difficulty reaching the attending provider, please page the [facility] (Director on Call) for [facility] on amion for assistance.

## 2022-10-23 NOTE — TOC Initial Note (Signed)
Transition of Care [facility]) - Initial/Assessment Note    Patient Details  Name: Caitlin Hardin MRN: [id number] Date of Birth: [birth year]  [facility]) CM/SW Contact:    Carol Ada, RN Phone Number: 10/23/2022, 12:53 PM  Clinical Narrative:      Chart reviewed.  Patient admitted for Cellulitis of lower extremity.  Patient is a resident of [facility].  I have spoken with Mardella Layman, Admission Coordinator for [facility].  Contact number for [facility] is [phone].  Mrs. Amedeo Plenty reports that prior to admission patient was able to ambulate without any assistive devices.  She reports that patient did require assistance with bathing and dressing.  Patient was on 02 prn at the facility.  Mrs. Amedeo Plenty reports that they use [facility] services if patient's need PT and OT.  Mrs. Amedeo Plenty reports that if patient is able to return on discharge that their facility can transport if before 5 pm.    TOC will continue to follow for discharge planning.                Expected Discharge Plan:  (Pending workup) Barriers to Discharge: No Barriers Identified   Patient Goals and CMS Choice            Expected Discharge Plan and Services   Discharge Planning Services: CM Consult   Living arrangements for the past 2 months: [place] (Patient a resident from [place])                                      Prior Living Arrangements/Services Living arrangements for the past 2 months: [place] (Patient a resident from [place])   Patient language and need for interpreter reviewed:: Yes        Need for Family Participation in Patient Care: Yes (Comment) (Supportive Cousin Ann) Care giver support system in place?: Yes (comment) Current home services:  (Has o2 at the facility.  Uses o2 prn at facility)    Activities of Daily Living Home Assistive Devices/Equipment: Eyeglasses, [place] [facility] (specify  type) ADL Screening (condition at time of admission) Patient's cognitive ability adequate to safely complete daily activities?: Yes Is the patient deaf or have difficulty hearing?: No Does the patient have difficulty seeing, even when wearing glasses/contacts?: Yes Does the patient have difficulty concentrating, remembering, or making decisions?: No Patient able to express need for assistance with ADLs?: Yes Does the patient have difficulty dressing or bathing?: No Independently performs ADLs?: Yes (appropriate for developmental age) Does the patient have difficulty walking or climbing stairs?: No Weakness of Legs: Left Weakness of Arms/Hands: None  Permission Sought/Granted   Permission granted to share information with : Yes, Verbal Permission Granted              Emotional Assessment Appearance:: Appears stated age Attitude/Demeanor/Rapport: Engaged Affect (typically observed): Pleasant Orientation: : Oriented to Self, Oriented to Place, Oriented to  Time, Oriented to Situation      Admission diagnosis:  Cellulitis [L03.90] Patient Active Problem List   Diagnosis Date Noted   Cellulitis of left lower extremity 10/22/2022   Sepsis 10/22/2022   Candidal intertrigo 10/22/2022   Class 3 severe obesity with body mass index (BMI) of 50.0 to 59.9 in adult 10/22/2022   Stage 3a chronic kidney disease 10/22/2022   Chronic obstructive pulmonary disease, unspecified 10/22/2022   Chronic respiratory failure with hypoxia  10/22/2022   AKI (acute kidney injury) 03/25/2021   Acute lower UTI 03/25/2021   OSA (obstructive sleep apnea) 03/25/2021   Essential hypertension 03/25/2021   Depression 03/25/2021   Diabetes mellitus type 2, noninsulin dependent 03/25/2021   Obesity, diabetes, and hypertension syndrome [id number]   Neutrophilic leukocytosis [id number]   Asthma without status asthmaticus 11/09/2013   Sleep apnea, obstructive 11/09/2013   PCP:  Verl Blalock, NP Pharmacy:   No Pharmacies Listed    Social Determinants of Health (SDOH) Social History: SDOH Screenings   Food Insecurity: No Food Insecurity (10/22/2022)  Housing: Low Risk  (10/22/2022)  Transportation Needs: No Transportation Needs (10/22/2022)  Utilities: Not At Risk (10/22/2022)  Tobacco Use: Low Risk  (10/22/2022)   SDOH Interventions:     Readmission Risk Interventions    03/27/2021   12:52 PM  Readmission Risk Prevention Plan  Post Dischage Appt Complete  Medication Screening Complete  Transportation Screening Complete

## 2022-10-23 NOTE — Evaluation (Signed)
Occupational Therapy Evaluation Patient Details Name: Caitlin Hardin MRN: [id number] DOB: 1965/07/31 Today's Date: 10/23/2022   History of Present Illness Pt is a 57 y.o. female with medical history significant for Class III obesity, BMI over 50, OSA on CPAP, HTN, DM, prior stroke, depression, COPD on home O2 at 2 L, CKD llla who presents to the ED after awakening with left lower extremity pain redness and swelling, which was noticed after sustaining a mechanical fall after waking at 5 AM and going to the bathroom.  She later developed a fever on her way to the hospital.  MD assessment includes: cellulitis of left lower extremity, sepsis, candidal intertrigo, and iron deficiency anemia.   Clinical Impression   Patient agreeable to OT evaluation. Pt presenting with decreased independence in self care, balance, functional mobility/transfers, and endurance. Pt from ALF. PTA she received assistance from staff for ADLs (bathing, dressing) and IADLs (med mgmt, meals, transportation). Pt currently functioning at Mod I for bed mobility, Min guard for functional mobility to the bathroom using a RW, Min guard for toilet transfer, Min guard for peri care/clothing management in standing, and supervision for sinkside grooming. Pt will benefit from skilled acute OT services to address deficits noted below. OT recommends ongoing therapy upon discharge to maximize safety and independence with ADLs, decrease fall risk, decrease caregiver burden, and promote return to PLOF.     Recommendations for follow up therapy are one component of a multi-disciplinary discharge planning process, led by the attending physician.  Recommendations may be updated based on patient status, additional functional criteria and insurance authorization.   Assistance Recommended at Discharge Intermittent Supervision/Assistance  Patient can return home with the following A little help with bathing/dressing/bathroom;Assistance with  cooking/housework;Assist for transportation;Help with stairs or ramp for entrance;Direct supervision/assist for medications management;A little help with walking and/or transfers    Functional Status Assessment  Patient has had a recent decline in their functional status and demonstrates the ability to make significant improvements in function in a reasonable and predictable amount of time.  Equipment Recommendations  None recommended by OT    Recommendations for Other Services       Precautions / Restrictions Precautions Precautions: Fall Restrictions Weight Bearing Restrictions: No      Mobility Bed Mobility Overal bed mobility: Modified Independent                  Transfers Overall transfer level: Needs assistance Equipment used: Rolling walker (2 wheels) Transfers: Sit to/from Stand Sit to Stand: Min guard, From elevated surface                  Balance Overall balance assessment: Needs assistance Sitting-balance support: Feet supported Sitting balance-Leahy Scale: Good     Standing balance support: Bilateral upper extremity supported, During functional activity, Single extremity supported Standing balance-Leahy Scale: Fair         ADL either performed or assessed with clinical judgement   ADL Overall ADL's : Needs assistance/impaired     Grooming: Supervision/safety;Standing;Wash/dry hands               Lower Body Dressing: Maximal assistance;Sitting/lateral leans Lower Body Dressing Details (indicate cue type and reason): socks Toilet Transfer: Ambulation;Rolling walker (2 wheels);Regular Toilet;Grab bars;Min guard   Toileting- Clothing Manipulation and Hygiene: Min guard;Sit to/from stand       Functional mobility during ADLs: Min guard;Rolling walker (2 wheels) (~10 ft to the bathroom)       Vision Baseline Vision/History: 1 Wears  glasses (readers only) Patient Visual Report: No change from baseline       Perception      Praxis      Pertinent Vitals/Pain Pain Assessment Pain Assessment: Faces Faces Pain Scale: Hurts a little bit Pain Location: L knee Pain Descriptors / Indicators: Aching, Sore Pain Intervention(s): Monitored during session, Premedicated before session, Repositioned     Hand Dominance     Extremity/Trunk Assessment Upper Extremity Assessment Upper Extremity Assessment: Generalized weakness   Lower Extremity Assessment Lower Extremity Assessment: Generalized weakness LLE: Unable to fully assess due to pain       Communication Communication Communication: No difficulties   Cognition Arousal/Alertness: Awake/alert Behavior During Therapy: WFL for tasks assessed/performed Overall Cognitive Status: Within Functional Limits for tasks assessed             General Comments       Exercises Other Exercises Other Exercises: OT provided education re: role of OT, OT POC, post acute recs, sitting up for all meals, EOB/OOB mobility with assistance, home/fall safety.     Shoulder Instructions      Home Living Family/patient expects to be discharged to:: Assisted living               Home Equipment: Rolling Walker (2 wheels);Wheelchair - manual;Grab bars - toilet   Additional Comments: Pt has a RW that was left by a previous resident at her facility but it is not in good condition per patient; facility owns a w/c if needed      Prior Functioning/Environment Prior Level of Function : Needs assist;History of Falls (last six months)       Physical Assist : ADLs (physical);Mobility (physical) Mobility (physical): Gait ADLs (physical): Bathing;Dressing;IADLs Mobility Comments: SBA with amb at baseline with a RW facility distances, two falls in the last six months secondary to legs buckling ADLs Comments: Staff assists with ADLs (bathing, dressing) and IADLs (med mgmt, meals, transportation)        OT Problem List: Decreased strength;Decreased activity  tolerance;Impaired balance (sitting and/or standing);Decreased knowledge of use of DME or AE;Pain;Cardiopulmonary status limiting activity      OT Treatment/Interventions: Self-care/ADL training;Therapeutic exercise;Neuromuscular education;Energy conservation;DME and/or AE instruction;Manual therapy;Modalities;Balance training;Patient/family education;Visual/perceptual remediation/compensation;Cognitive remediation/compensation;Therapeutic activities;Splinting    OT Goals(Current goals can be found in the care plan section) Acute Rehab OT Goals Patient Stated Goal: return to ALF OT Goal Formulation: With patient Time For Goal Achievement: 11/06/22 Potential to Achieve Goals: Good   OT Frequency: Min 1X/week    Co-evaluation              AM-PAC OT "6 Clicks" Daily Activity     Outcome Measure Help from another person eating meals?: None Help from another person taking care of personal grooming?: A Little Help from another person toileting, which includes using toliet, bedpan, or urinal?: A Little Help from another person bathing (including washing, rinsing, drying)?: A Little Help from another person to put on and taking off regular upper body clothing?: A Little Help from another person to put on and taking off regular lower body clothing?: A Little 6 Click Score: 19   End of Session Equipment Utilized During Treatment: Gait belt;Rolling walker (2 wheels) Nurse Communication: Mobility status  Activity Tolerance: Patient tolerated treatment well Patient left: with call bell/phone within reach;with bed alarm set;with family/visitor present  OT Visit Diagnosis: Unsteadiness on feet (R26.81);Muscle weakness (generalized) (M62.81);History of falling (Z91.81);Pain Pain - Right/Left: Left Pain - part of body: Leg  Time: [id number] OT Time Calculation (min): 13 min Charges:  OT General Charges $OT Visit: 1 Visit OT Evaluation $OT Eval Low Complexity: 1 Low  [facility] MS, OTR/L ascom [phone]  10/23/22, 6:16 PM

## 2022-10-24 DIAGNOSIS — L03116 Cellulitis of left lower limb: Secondary | ICD-10-CM | POA: Diagnosis not present

## 2022-10-24 LAB — CBC
HCT: 31.5 % — ABNORMAL LOW (ref 36.0–46.0)
Hemoglobin: 10.4 g/dL — ABNORMAL LOW (ref 12.0–15.0)
MCH: 30.9 pg (ref 26.0–34.0)
MCHC: 33 g/dL (ref 30.0–36.0)
MCV: 93.5 fL (ref 80.0–100.0)
Platelets: 165 10*3/uL (ref 150–400)
RBC: 3.37 MIL/uL — ABNORMAL LOW (ref 3.87–5.11)
RDW: 14.6 % (ref 11.5–15.5)
WBC: 15.7 10*3/uL — ABNORMAL HIGH (ref 4.0–10.5)
nRBC: 0 % (ref 0.0–0.2)

## 2022-10-24 LAB — MAGNESIUM: Magnesium: 2.1 mg/dL (ref 1.7–2.4)

## 2022-10-24 LAB — GLUCOSE, CAPILLARY
Glucose-Capillary: 100 mg/dL — ABNORMAL HIGH (ref 70–99)
Glucose-Capillary: 97 mg/dL (ref 70–99)
Glucose-Capillary: 119 mg/dL — ABNORMAL HIGH (ref 70–99)
Glucose-Capillary: 100 mg/dL — ABNORMAL HIGH (ref 70–99)

## 2022-10-24 LAB — HEMOGLOBIN A1C
Hgb A1c MFr Bld: 6.2 % — ABNORMAL HIGH (ref 4.8–5.6)
Mean Plasma Glucose: 131 mg/dL

## 2022-10-24 LAB — BASIC METABOLIC PANEL
Anion gap: 11 (ref 5–15)
BUN: 13 mg/dL (ref 6–20)
CO2: 22 mmol/L (ref 22–32)
Calcium: 8.5 mg/dL — ABNORMAL LOW (ref 8.9–10.3)
Chloride: 104 mmol/L (ref 98–111)
Creatinine, Ser: 0.86 mg/dL (ref 0.44–1.00)
GFR, Estimated: >60 mL/min (ref >60)
Glucose, Bld: 121 mg/dL — ABNORMAL HIGH (ref 70–99)
Potassium: 3.6 mmol/L (ref 3.5–5.1)
Sodium: 137 mmol/L (ref 135–145)

## 2022-10-24 LAB — PHOSPHORUS: Phosphorus: 3.1 mg/dL (ref 2.5–4.6)

## 2022-10-24 MED ORDER — IPRATROPIUM-ALBUTEROL 0.5-2.5 (3) MG/3ML IN SOLN
3.0000 mL | Freq: Two times a day (BID) | RESPIRATORY_TRACT | Status: DC
  Administered 2022-10-24 – 2022-10-27 (×6): 3 mL via RESPIRATORY_TRACT
  Filled 2022-10-24 (×6): qty 3

## 2022-10-24 MED ORDER — VANCOMYCIN HCL 1250 MG/250ML IV SOLN
1250.0000 mg | INTRAVENOUS | Status: DC
  Administered 2022-10-24 – 2022-10-26 (×3): 1250 mg via INTRAVENOUS
  Filled 2022-10-24 (×3): qty 250

## 2022-10-24 NOTE — Progress Notes (Signed)
[facility] Progress Note  Patient: Caitlin Hardin    [id number]  DOA: 10/22/2022     Date of Service: the patient was seen and examined on 10/24/2022  Chief Complaint  Patient presents with   Fever    Patient presents with LEFT lower leg redness, pain, and hot to touch (appears to be cellulitic); Patient did have a mechanical fall this morning around 05:00 when she was walking to the restroom; Ever since that fall, she had progressively worsening LEFT lower leg pain; Patient is febrile with temp of 103.1 (Tylenol 1,000 mg given); Sepsis work-up initiated   Fall   Brief hospital course: Jamy Donehue is a 57 y.o. female with medical history significant for Class III obesity, BMI over 50, OSA on CPAP, HTN, DM, prior stroke, depression, COPD on home O2 at 2 L, CKD llla who presents to the ED after awakening with left lower extremity pain redness and swelling, which was noticed after sustaining a mechanical fall after waking at 5 AM and going to the bathroom.  She later developed a fever on her way to the hospital.   ED w/up: Tmax 103.1 with pulse 112 and respirations 23, O2 sat 95% on room air and BP 115/60.  Labs notable for WBC 21,000 with lactic acid 1.5.  Respiratory viral panel negative.CMP notable for creatinine 1.25 which is about her baseline and minor electrolyte abnormalities.  Total bilirubin 1.5.  Urinalysis mostly unremarkable. EKG, personally viewed and interpreted showing sinus tachycardia at 110 with no acute ST-T wave changes. Chest x-ray showed no focal consolidations Left lower extremity Doppler without evidence of DVT Patient was treated with sepsis fluids, started on cefepime and metronidazole and vancomycin. She was also given fluconazole for candidal rash in skin folds.  Hospitalist consulted for admission.    Assessment and Plan: Cellulitis of left lower extremity Sepsis, criteria include fever, tachypnea, soft blood pressure, leukocytosis lactic  acidosis, Lower extremity Doppler negative for DVT Continue cefepime, Flagyl and vancomycin S/p IVF for Sepsis    Candidal intertrigo Continue fluconazole started in the ED, 100 mg daily for 10 days Skin protective barriers   Chronic obstructive pulmonary disease, unspecified (HCC) Chronic respiratory failure with hypoxia Not acutely exacerbated -Continue home inhalers - DuoNebs as needed     Stage 3a chronic kidney disease ([place]) Renal function at baseline   Class 3 severe obesity with body mass index (BMI) of 50.0 to 123456 in adult  Complicating factor to overall prognosis and care   Diabetes mellitus type 2, noninsulin dependent (HCC) Sliding scale insulin coverage   Depression Continue Zoloft and bupropion   Essential hypertension Holding antihypertensives due to soft blood pressures likely related to sepsis.  Resume as appropriate   OSA (obstructive sleep apnea) CPAP nightly   Iron deficiency anemia, iron level 20, transferrin saturation 7% at lower end,  Continue oral iron supplement   Vitamin D insufficiency, vitamin D level 25, started oral supplement.   Body mass index is 52.86 kg/m.  Interventions:       Diet: Heart healthy/diabetic diet DVT Prophylaxis: Subcutaneous Lovenox   Advance goals of care discussion: Full code  Family Communication: family was not present at bedside, at the time of interview.  The pt provided permission to discuss medical plan with the family. Opportunity was given to ask question and all questions were answered satisfactorily.   Disposition:  Pt is from ALF, admitted with sepsis due to LLE cellulitis, still on IV Abx, which precludes a safe  discharge. Discharge to ALF vs SNF, TBD after PT/OT eval, when clinically stable.  May need few days to improve.  Subjective: No significant events overnight, patient overall feels improvement but is still has significant tenderness in the left lower extremity.  Denies any other  complaints.    Physical Exam: General: NAD, lying comfortably Appear in no distress, affect appropriate Eyes: PERRLA ENT: Oral Mucosa Clear, moist  Neck: no JVD,  Cardiovascular: S1 and S2 Present, no Murmur,  Respiratory: good respiratory effort, Bilateral Air entry equal and Decreased, no Crackles, no wheezes Abdomen: Bowel Sound present, Soft and no tenderness,  Skin: no rashes Extremities: LLE mild erythema and tenderness, mild edema. no calf tenderness Neurologic: without any new focal findings Gait not checked due to patient safety concerns  Vitals:   10/23/22 1617 10/23/22 2010 10/24/22 0035 10/24/22 0800  BP: (!) 141/63  136/85 120/70  Pulse: (!) 103  96 88  Resp: 18  20 18   Temp: 99.5 F (37.5 C)  98.4 F (36.9 C) 98.5 F (36.9 C)  TempSrc:    Oral  SpO2: 100% (!) 88% 98% 97%  Weight:      Height:        Intake/Output Summary (Last 24 hours) at 10/24/2022 1440 Last data filed at 10/24/2022 1000 Gross per 24 hour  Intake 1979.22 ml  Output --  Net 1979.22 ml   Filed Weights   10/22/22 1830 10/22/22 2212  Weight: 116.1 kg 118.7 kg    Data Reviewed: I have personally reviewed and interpreted daily labs, tele strips, imagings as discussed above. I reviewed all nursing notes, pharmacy notes, vitals, pertinent old records I have discussed plan of care as described above with RN and patient/family.  CBC: Recent Labs  Lab 10/22/22 1831 10/23/22 0413 10/24/22 0407  WBC 21.0* 18.5* 15.7*  NEUTROABS 17.6*  --   --   HGB 12.2 10.6* 10.4*  HCT 37.3 33.2* 31.5*  MCV 94.9 96.2 93.5  PLT 143* 140* [id number]   Basic Metabolic Panel: Recent Labs  Lab 10/22/22 1831 10/23/22 0407 10/23/22 0413 10/24/22 0407  NA 131*  --  136 137  K 4.9  --  3.7 3.6  CL 97*  --  102 104  CO2 25  --  25 22  GLUCOSE 153*  --  128* 121*  BUN 17  --  15 13  CREATININE 1.25*  --  1.08* 0.86  CALCIUM 8.7*  --  8.6* 8.5*  MG  --  1.9  --  2.1  PHOS  --  2.9  --  3.1     Studies: No results found.  Scheduled Meds:  buPROPion  100 mg Oral BID   calcium carbonate  1 tablet Oral BID WC   docusate sodium  100 mg Oral BID   enoxaparin (LOVENOX) injection  0.5 mg/kg Subcutaneous Q24H   ferrous sulfate  325 mg Oral BID   fluconazole  100 mg Oral Daily   gabapentin  300 mg Oral BID   insulin aspart  0-20 Units Subcutaneous TID WC   insulin aspart  0-5 Units Subcutaneous QHS   ipratropium-albuterol  3 mL Nebulization BID   latanoprost  1 drop Both Eyes QHS   liver oil-zinc oxide  1 Application Topical q morning   magnesium oxide  400 mg Oral BID   oxybutynin  5 mg Oral BID   pantoprazole  40 mg Oral Daily   sertraline  25 mg Oral Daily   Vitamin D (Ergocalciferol)  50,000 Units Oral Q7 days   Continuous Infusions:  ceFEPime (MAXIPIME) IV 2 g (10/24/22 1350)   metronidazole 500 mg (10/24/22 0615)   vancomycin     PRN Meds: acetaminophen **OR** acetaminophen, albuterol, HYDROcodone-acetaminophen, ketorolac, morphine injection, ondansetron **OR** ondansetron (ZOFRAN) IV  Time spent: 35 minutes  Author: Val Riles. MD [facility] 10/24/2022 2:40 PM  To reach On-call, see care teams to locate the attending and reach out to them via [email or web address]. If 7PM-7AM, please contact night-coverage If you still have difficulty reaching the attending provider, please page the [facility] (Director on Call) for [facility] on amion for assistance.

## 2022-10-24 NOTE — Progress Notes (Signed)
Physical Therapy Treatment Patient Details Name: Caitlin Hardin MRN: [id number] DOB: [birth year] Today's Date: 10/24/2022   History of Present Illness Pt is a 57 y.o. female with medical history significant for Class III obesity, BMI over 50, OSA on CPAP, HTN, DM, prior stroke, depression, COPD on home O2 at 2 L, CKD llla who presents to the ED after awakening with left lower extremity pain redness and swelling, which was noticed after sustaining a mechanical fall after waking at 5 AM and going to the bathroom.  She later developed a fever on her way to the hospital.  MD assessment includes: cellulitis of left lower extremity, sepsis, candidal intertrigo, and iron deficiency anemia.    PT Comments    Pt was pleasant and motivated to participate during the session and put forth good effort throughout. Pt required significant time and effort with bed mobility tasks along with use of the bed rail but no physical assist required during sup to/from sit.  Pt able to stand from standard height bed with min cues for hand positioning with no instability noted.  Pt generally steady with no overt LOB during gait training but continued to amb with very slow, antalgic cadence with min cues given for step-to sequencing to assist with pain control.  Pt will benefit from continued PT services upon discharge to safely address deficits listed in patient problem list for decreased caregiver assistance and eventual return to PLOF.     Recommendations for follow up therapy are one component of a multi-disciplinary discharge planning process, led by the attending physician.  Recommendations may be updated based on patient status, additional functional criteria and insurance authorization.  Follow Up Recommendations       Assistance Recommended at Discharge Intermittent Supervision/Assistance  Patient can return home with the following A little help with walking and/or transfers;A little help with  bathing/dressing/bathroom;Assistance with cooking/housework;Direct supervision/assist for medications management;Assist for transportation   Equipment Recommendations  Rolling walker (2 wheels)    Recommendations for Other Services       Precautions / Restrictions Precautions Precautions: Fall Restrictions Weight Bearing Restrictions: No     Mobility  Bed Mobility Overal bed mobility: Modified Independent             General bed mobility comments: Extra time, effort, and use of bed rail    Transfers Overall transfer level: Needs assistance Equipment used: Rolling walker (2 wheels) Transfers: Sit to/from Stand Sit to Stand: Supervision           General transfer comment: Min verbal cues for hand placement    Ambulation/Gait Ambulation/Gait assistance: Min guard Gait Distance (Feet): 15 Feet Assistive device: Rolling walker (2 wheels) Gait Pattern/deviations: Step-to pattern, Antalgic, Decreased step length - right, Decreased stance time - left Gait velocity: decreased     General Gait Details: Min verbal cues for step-to sequencing for LLE pain control   Stairs             Wheelchair Mobility    Modified Rankin (Stroke Patients Only)       Balance Overall balance assessment: Needs assistance Sitting-balance support: Feet supported Sitting balance-Leahy Scale: Good     Standing balance support: Bilateral upper extremity supported, During functional activity, Reliant on assistive device for balance Standing balance-Leahy Scale: Fair                              Cognition Arousal/Alertness: Awake/alert Behavior During Therapy: WFL for  tasks assessed/performed Overall Cognitive Status: Within Functional Limits for tasks assessed                                          Exercises Total Joint Exercises Ankle Circles/Pumps: AROM, Strengthening, Both, 5 reps, 10 reps Quad Sets: Strengthening, Both, 5 reps, 10  reps Gluteal Sets: Strengthening, Both, 5 reps, 10 reps Long Arc Quad: AROM, Strengthening, Both, 10 reps Knee Flexion: AROM, Strengthening, Both, 10 reps Other Exercises Other Exercises: HEP education for BLE APs, QS, and GS    General Comments        Pertinent Vitals/Pain Pain Assessment Pain Assessment: 0-10 Pain Score: 9  Pain Location: L knee Pain Descriptors / Indicators: Aching, Sore Pain Intervention(s): Repositioned, Premedicated before session, Patient requesting pain meds-RN notified    Home Living                          Prior Function            PT Goals (current goals can now be found in the care plan section) Progress towards PT goals: Progressing toward goals    Frequency    Min 2X/week      PT Plan Current plan remains appropriate    Co-evaluation              AM-PAC PT "6 Clicks" Mobility   Outcome Measure  Help needed turning from your back to your side while in a flat bed without using bedrails?: A Little Help needed moving from lying on your back to sitting on the side of a flat bed without using bedrails?: A Little Help needed moving to and from a bed to a chair (including a wheelchair)?: A Little Help needed standing up from a chair using your arms (e.g., wheelchair or bedside chair)?: A Little Help needed to walk in hospital room?: A Little Help needed climbing 3-5 steps with a railing? : A Lot 6 Click Score: 17    End of Session Equipment Utilized During Treatment: Gait belt Activity Tolerance: Patient tolerated treatment well Patient left: in bed;with call bell/phone within reach;with bed alarm set Nurse Communication: Mobility status PT Visit Diagnosis: Unsteadiness on feet (R26.81);History of falling (Z91.81);Difficulty in walking, not elsewhere classified (R26.2);Muscle weakness (generalized) (M62.81);Pain Pain - Right/Left: Left Pain - part of body: Leg;Knee     Time: 1340-1403 PT Time Calculation (min)  (ACUTE ONLY): 23 min  Charges:  $Gait Training: 8-22 mins $Therapeutic Exercise: 8-22 mins                    D. Scott Tykeisha Peer PT, DPT 10/24/22, 2:21 PM

## 2022-10-24 NOTE — Consult Note (Signed)
Pharmacy Antibiotic Note  Caitlin Hardin is a 57 y.o. female with a medical history significant for Class III obesity, BMI over 50, OSA on CPAP, HTN, DM, prior stroke, depression, COPD on home O2 at 2L, CKD IIIa, who was admitted on 10/22/2022 with cellulitis. Pharmacy has been consulted for vancomycin and cefepime dosing. Metronidazole has also been ordered.   Vancomycin 2 grams IV x 1 given 3/31 @ 1933 Vancomycin 500 mg IV x 1 given 4/1 @ 0017   Plan: Increase to vancomycin 1250 mg IV every 24 hours as renal function has improved Goal AUC 400-550 Estimated AUC 465.4, Cmin 11.5 Wt 118.7 kg, Scr 0.86, Vd coefficient 0.5  Vancomycin levels at steady state or as clinically indicated. Continue cefepime 2 grams IV every 8 hours   Flagyl 500 mg IV every 12 hours ordered by provider Follow renal function and cultures for adjustments   Height: 4\' 11"  (149.9 cm) Weight: 118.7 kg (261 lb 11.2 oz) IBW/kg (Calculated) : 43.2  Temp (24hrs), Avg:98.8 F (37.1 C), Min:98.4 F (36.9 C), Max:99.5 F (37.5 C)  Recent Labs  Lab 10/22/22 1831 10/22/22 1937 10/23/22 0413 10/24/22 0407  WBC 21.0*  --  18.5* 15.7*  CREATININE 1.25*  --  1.08* 0.86  LATICACIDVEN 1.9 1.5  --   --      Estimated Creatinine Clearance: 83.6 mL/min (by C-G formula based on SCr of 0.86 mg/dL).    No Known Allergies  Antimicrobials this admission: Vancomycin 3/31 >>  Cefepime 3/31 >>  Flagyl 3/31 >>  Dose adjustments this admission: N/A  Microbiology results: 3/31 BCx: NGTD 3/31 UCx: >100,000 CFU/mL staph simulans 3/31 MRSA PCR: negative  3/31 Respiratory panel: negative   Thank you for allowing pharmacy to be a part of this patient's care.  Si Raider, PharmD Candidate, Class of 2026  10/24/2022 10:59 AM

## 2022-10-24 NOTE — TOC Progression Note (Signed)
Transition of Care [facility]) - Progression Note    Patient Details  Name: Caitlin Hardin MRN: [id number] Date of Birth: 15-Dec-1965  Transition of Care [facility]) CM/SW [place], RN Phone Number: 10/24/2022, 12:44 PM  Clinical Narrative:   I have spoken with Etter Sjogren, Resident Director at [facility].    I have informed her that patient will need PT/OT on discharge back to facility.  I have also informed her that [facility] will provide PT/OT services for [name].  Tammy reports that [name] has used [facility] in the past.  I have also informed Tammy that a Rolling walker has been ordered for patient via [employer].  I have informed Tammy that patient is currently being weaned off o2.  Tammy states that patient does use 02 prn at the facility.  Tammy also informs me that patient has a CPAP that she wears at night at the facility.  Tammy reports that their facility can transport patient back to the facility if they have availability when patient is able to discharge.    TOC will continue to follow for discharge planning.      Expected Discharge Plan:  (Pending workup) Barriers to Discharge: No Barriers Identified  Expected Discharge Plan and Services   Discharge Planning Services: CM Consult   Living arrangements for the past 2 months: [place] (Patient a resident from [place])                                       Social Determinants of Health (SDOH) Interventions SDOH Screenings   Food Insecurity: No Food Insecurity (10/22/2022)  Housing: Low Risk  (10/22/2022)  Transportation Needs: No Transportation Needs (10/22/2022)  Utilities: Not At Risk (10/22/2022)  Tobacco Use: Low Risk  (10/22/2022)    Readmission Risk Interventions    03/27/2021   12:52 PM  Readmission Risk Prevention Plan  Post Dischage Appt Complete  Medication Screening Complete  Transportation Screening Complete

## 2022-10-24 NOTE — TOC Progression Note (Signed)
Transition of Care [facility]) - Progression Note    Patient Details  Name: Caitlin Hardin MRN: [id number] Date of Birth: November 15, 1965  Transition of Care [facility]) CM/SW [place], RN Phone Number: 10/24/2022, 12:16 PM  Clinical Narrative:    I have meet with patient at bedside today.  I have informed Patient that PT has recommended Rolling walker and HH PT/OT. Patient did not have a preference for [facility] PT/OT.  I have asked Corene Cornea with [facility] to accept PT and OT referral.  I have asked [employer] to provide patient with rolling walker.  Noted that Nursing weaning o2.  Message left for admission coordinator at [facility].    TOC will follow for discharge planning.      Expected Discharge Plan:  (Pending workup) Barriers to Discharge: No Barriers Identified  Expected Discharge Plan and Services   Discharge Planning Services: CM Consult   Living arrangements for the past 2 months: [place] (Patient a resident from [place])                                       Social Determinants of Health (SDOH) Interventions SDOH Screenings   Food Insecurity: No Food Insecurity (10/22/2022)  Housing: Low Risk  (10/22/2022)  Transportation Needs: No Transportation Needs (10/22/2022)  Utilities: Not At Risk (10/22/2022)  Tobacco Use: Low Risk  (10/22/2022)    Readmission Risk Interventions    03/27/2021   12:52 PM  Readmission Risk Prevention Plan  Post Dischage Appt Complete  Medication Screening Complete  Transportation Screening Complete

## 2022-10-24 NOTE — Plan of Care (Signed)
  Problem: Education: Goal: Ability to describe self-care measures that may prevent or decrease complications (Diabetes Survival Skills Education) will improve Outcome: Progressing   Problem: Coping: Goal: Ability to adjust to condition or change in health will improve Outcome: Progressing   Problem: Fluid Volume: Goal: Ability to maintain a balanced intake and output will improve Outcome: Progressing   Problem: Health Behavior/Discharge Planning: Goal: Ability to identify and utilize available resources and services will improve Outcome: Progressing   Problem: Nutritional: Goal: Maintenance of adequate nutrition will improve Outcome: Progressing   Problem: Skin Integrity: Goal: Risk for impaired skin integrity will decrease Outcome: Progressing   Problem: Tissue Perfusion: Goal: Adequacy of tissue perfusion will improve Outcome: Progressing   

## 2022-10-25 ENCOUNTER — Inpatient Hospital Stay: Payer: Medicare Other

## 2022-10-25 DIAGNOSIS — L03116 Cellulitis of left lower limb: Secondary | ICD-10-CM | POA: Diagnosis not present

## 2022-10-25 LAB — BASIC METABOLIC PANEL
Anion gap: 12 (ref 5–15)
BUN: 8 mg/dL (ref 6–20)
CO2: 23 mmol/L (ref 22–32)
Calcium: 8.9 mg/dL (ref 8.9–10.3)
Chloride: 101 mmol/L (ref 98–111)
Creatinine, Ser: 0.73 mg/dL (ref 0.44–1.00)
GFR, Estimated: >60 mL/min (ref >60)
Glucose, Bld: 107 mg/dL — ABNORMAL HIGH (ref 70–99)
Potassium: 3.8 mmol/L (ref 3.5–5.1)
Sodium: 136 mmol/L (ref 135–145)

## 2022-10-25 LAB — SEDIMENTATION RATE: Sed Rate: 106 mm/hr — ABNORMAL HIGH (ref 0–30)

## 2022-10-25 LAB — GLUCOSE, CAPILLARY
Glucose-Capillary: 103 mg/dL — ABNORMAL HIGH (ref 70–99)
Glucose-Capillary: 129 mg/dL — ABNORMAL HIGH (ref 70–99)
Glucose-Capillary: 125 mg/dL — ABNORMAL HIGH (ref 70–99)
Glucose-Capillary: 129 mg/dL — ABNORMAL HIGH (ref 70–99)

## 2022-10-25 LAB — CBC
HCT: 33.4 % — ABNORMAL LOW (ref 36.0–46.0)
Hemoglobin: 10.8 g/dL — ABNORMAL LOW (ref 12.0–15.0)
MCH: 30.2 pg (ref 26.0–34.0)
MCHC: 32.3 g/dL (ref 30.0–36.0)
MCV: 93.3 fL (ref 80.0–100.0)
Platelets: 203 10*3/uL (ref 150–400)
RBC: 3.58 MIL/uL — ABNORMAL LOW (ref 3.87–5.11)
RDW: 14.4 % (ref 11.5–15.5)
WBC: 21.7 10*3/uL — ABNORMAL HIGH (ref 4.0–10.5)
nRBC: 0 % (ref 0.0–0.2)

## 2022-10-25 LAB — PHOSPHORUS: Phosphorus: 3.5 mg/dL (ref 2.5–4.6)

## 2022-10-25 LAB — URINE CULTURE: Culture: >=100000 — AB

## 2022-10-25 LAB — PROCALCITONIN: Procalcitonin: 0.76 ng/mL

## 2022-10-25 LAB — MAGNESIUM: Magnesium: 2.1 mg/dL (ref 1.7–2.4)

## 2022-10-25 LAB — C-REACTIVE PROTEIN: CRP: 35.7 mg/dL — ABNORMAL HIGH (ref <1.0)

## 2022-10-25 MED ORDER — IOHEXOL 300 MG/ML  SOLN
100.0000 mL | Freq: Once | INTRAMUSCULAR | Status: AC | PRN
  Administered 2022-10-25: 100 mL via INTRAVENOUS

## 2022-10-25 MED ORDER — POLYETHYLENE GLYCOL 3350 17 G PO PACK
17.0000 g | PACK | Freq: Every day | ORAL | Status: DC
  Administered 2022-10-25 – 2022-11-01 (×8): 17 g via ORAL
  Filled 2022-10-25 (×8): qty 1

## 2022-10-25 MED ORDER — BISACODYL 10 MG RE SUPP: 10.0000 mg | Freq: Every evening | RECTAL | Status: DC | PRN

## 2022-10-25 MED ORDER — BISACODYL 5 MG PO TBEC
10.0000 mg | DELAYED_RELEASE_TABLET | Freq: Once | ORAL | Status: AC
  Administered 2022-10-25: 10 mg via ORAL
  Filled 2022-10-25: qty 2

## 2022-10-25 NOTE — Progress Notes (Signed)
Physical Therapy Treatment Patient Details Name: Caitlin Hardin MRN: [id number] DOB: 05/06/1966 Today's Date: 10/25/2022   History of Present Illness Pt is a 57 y.o. female with medical history significant for Class III obesity, BMI over 50, OSA on CPAP, HTN, DM, prior stroke, depression, COPD on home O2 at 2 L, CKD llla who presents to the ED after awakening with left lower extremity pain redness and swelling, which was noticed after sustaining a mechanical fall after waking at 5 AM and going to the bathroom.  She later developed a fever on her way to the hospital.  MD assessment includes: cellulitis of left lower extremity, sepsis, candidal intertrigo, and iron deficiency anemia.    PT Comments    Pt was pleasant and motivated to participate during the session and put forth good effort throughout. Pt required min A for trunk control during sup to sit to pt's right side, typically moves to her L with mod I.  Pt required extra time and effort as well as BUE assist to stand from multiple height surfaces but was steady without LOB and was able to amb 2 x 30 feet this session with no overt LOB or buckling.  Pt will benefit from continued PT services upon discharge to safely address deficits listed in patient problem list for decreased caregiver assistance and eventual return to PLOF.     Recommendations for follow up therapy are one component of a multi-disciplinary discharge planning process, led by the attending physician.  Recommendations may be updated based on patient status, additional functional criteria and insurance authorization.  Follow Up Recommendations       Assistance Recommended at Discharge Intermittent Supervision/Assistance  Patient can return home with the following A little help with walking and/or transfers;A little help with bathing/dressing/bathroom;Assistance with cooking/housework;Direct supervision/assist for medications management;Assist for transportation   Equipment  Recommendations  Rolling walker (2 wheels)    Recommendations for Other Services       Precautions / Restrictions Precautions Precautions: Fall Restrictions Weight Bearing Restrictions: No     Mobility  Bed Mobility Overal bed mobility: Needs Assistance Bed Mobility: Supine to Sit     Supine to sit: Min assist     General bed mobility comments: Min A with sup to sit for trunk control going to the R side for the first time with this PT; pt typically goes to the L side with Mod I    Transfers Overall transfer level: Needs assistance Equipment used: Rolling walker (2 wheels) Transfers: Sit to/from Stand Sit to Stand: Supervision           General transfer comment: Min verbal cues for hand placement most notably during stand to sit    Ambulation/Gait Ambulation/Gait assistance: Min guard Gait Distance (Feet): 30 Feet x 2 Assistive device: Rolling walker (2 wheels) Gait Pattern/deviations: Step-to pattern, Antalgic, Decreased step length - right, Decreased stance time - left, Step-through pattern Gait velocity: decreased     General Gait Details: Mix of step-to and step-through patterns with slow cadence and short B step length but steady without LOB or buckling   Stairs             Wheelchair Mobility    Modified Rankin (Stroke Patients Only)       Balance Overall balance assessment: Needs assistance Sitting-balance support: Feet supported Sitting balance-Leahy Scale: Good     Standing balance support: Single extremity supported, During functional activity, Reliant on assistive device for balance Standing balance-Leahy Scale: Fair  Cognition Arousal/Alertness: Awake/alert Behavior During Therapy: WFL for tasks assessed/performed Overall Cognitive Status: Within Functional Limits for tasks assessed                                          Exercises Total Joint Exercises Ankle  Circles/Pumps: AROM, Strengthening, Both, 10 reps Quad Sets: Strengthening, Both, 10 reps Long Arc Quad: AROM, Strengthening, Both, 10 reps Knee Flexion: AROM, Strengthening, Both, 10 reps Other Exercises Other Exercises: Sit to/from stand transfer training from various height surfaces    General Comments        Pertinent Vitals/Pain Pain Assessment Pain Assessment: 0-10 Pain Score: 6  Pain Location: LLE Pain Descriptors / Indicators: Aching, Sore Pain Intervention(s): Repositioned, Premedicated before session, Monitored during session    Home Living                          Prior Function            PT Goals (current goals can now be found in the care plan section) Progress towards PT goals: Progressing toward goals    Frequency    Min 2X/week      PT Plan Current plan remains appropriate    Co-evaluation              AM-PAC PT "6 Clicks" Mobility   Outcome Measure  Help needed turning from your back to your side while in a flat bed without using bedrails?: A Little Help needed moving from lying on your back to sitting on the side of a flat bed without using bedrails?: A Little Help needed moving to and from a bed to a chair (including a wheelchair)?: A Little Help needed standing up from a chair using your arms (e.g., wheelchair or bedside chair)?: A Little Help needed to walk in hospital room?: A Little Help needed climbing 3-5 steps with a railing? : A Little 6 Click Score: 18    End of Session Equipment Utilized During Treatment: Gait belt Activity Tolerance: Patient tolerated treatment well Patient left: with call bell/phone within reach;in chair;with chair alarm set Nurse Communication: Mobility status PT Visit Diagnosis: Unsteadiness on feet (R26.81);History of falling (Z91.81);Difficulty in walking, not elsewhere classified (R26.2);Muscle weakness (generalized) (M62.81);Pain Pain - Right/Left: Left Pain - part of body: Leg;Knee      Time: [id number] PT Time Calculation (min) (ACUTE ONLY): 35 min  Charges:  $Gait Training: 8-22 mins $Therapeutic Exercise: 8-22 mins                     D. Scott Aliyah Abeyta PT, DPT 10/25/22, 3:56 PM

## 2022-10-25 NOTE — Progress Notes (Signed)
Occupational Therapy Treatment Patient Details Name: Caitlin Hardin MRN: [id number] DOB: 1966/03/17 Today's Date: 10/25/2022   History of present illness Pt is a 57 y.o. female with medical history significant for Class III obesity, BMI over 50, OSA on CPAP, HTN, DM, prior stroke, depression, COPD on home O2 at 2 L, CKD llla who presents to the ED after awakening with left lower extremity pain redness and swelling, which was noticed after sustaining a mechanical fall after waking at 5 AM and going to the bathroom.  She later developed a fever on her way to the hospital.  MD assessment includes: cellulitis of left lower extremity, sepsis, candidal intertrigo, and iron deficiency anemia.   OT comments  Patient received sitting up in recliner and agreeable to OT. Tx session targeted increasing activity tolerance for improved ADL completion. Pt required Max A to don B shoes before completing functional mobility to the bathroom using a RW. Pt with continent void on toilet. She required supervision with use of grab for toilet transfer and then completed peri care in standing with Min guard for safety. Pt deferred further grooming tasks and requested to return to recliner after completing toileting routine. Pt endorsed 9/10 pain in BLEs during activity (RN notified pt requesting pain meds). Pt left as received with all needs in reach. Pt is making progress toward goal completion. D/C recommendation remains appropriate. OT will continue to follow acutely.    Recommendations for follow up therapy are one component of a multi-disciplinary discharge planning process, led by the attending physician.  Recommendations may be updated based on patient status, additional functional criteria and insurance authorization.    Assistance Recommended at Discharge Intermittent Supervision/Assistance  Patient can return home with the following  A little help with bathing/dressing/bathroom;Assistance with  cooking/housework;Assist for transportation;Help with stairs or ramp for entrance;Direct supervision/assist for medications management;A little help with walking and/or transfers   Equipment Recommendations  None recommended by OT    Recommendations for Other Services      Precautions / Restrictions Precautions Precautions: Fall Restrictions Weight Bearing Restrictions: No       Mobility Bed Mobility     General bed mobility comments: received/left in recliner    Transfers Overall transfer level: Needs assistance Equipment used: Rolling walker (2 wheels) Transfers: Sit to/from Stand Sit to Stand: Supervision                 Balance Overall balance assessment: Needs assistance Sitting-balance support: Feet supported Sitting balance-Leahy Scale: Good     Standing balance support: Single extremity supported, Bilateral upper extremity supported, During functional activity Standing balance-Leahy Scale: Fair         ADL either performed or assessed with clinical judgement   ADL Overall ADL's : Needs assistance/impaired     Grooming: Supervision/safety;Standing;Wash/dry hands               Lower Body Dressing: Sitting/lateral leans;Maximal assistance Lower Body Dressing Details (indicate cue type and reason): to don B shoes and tie shoe laces Toilet Transfer: Ambulation;Rolling walker (2 wheels);Regular Toilet;Grab bars;Supervision/safety   Toileting- Clothing Manipulation and Hygiene: Min guard;Sit to/from stand Toileting - Clothing Manipulation Details (indicate cue type and reason): after continent void in toilet     Functional mobility during ADLs: Min guard;Rolling walker (2 wheels) (~56ft to the bathroom)      Extremity/Trunk Assessment Upper Extremity Assessment Upper Extremity Assessment: Generalized weakness   Lower Extremity Assessment Lower Extremity Assessment: Generalized weakness  Vision Baseline Vision/History: 1 Wears  glasses (readers only) Patient Visual Report: No change from baseline     Perception     Praxis      Cognition Arousal/Alertness: Awake/alert Behavior During Therapy: WFL for tasks assessed/performed Overall Cognitive Status: Within Functional Limits for tasks assessed              Exercises      Shoulder Instructions       General Comments      Pertinent Vitals/ Pain       Pain Assessment Pain Assessment: 0-10 Pain Score: 9  Pain Location: BLEs Pain Descriptors / Indicators: Aching, Sore Pain Intervention(s): Limited activity within patient's tolerance, Monitored during session, Repositioned, Patient requesting pain meds-RN notified  Home Living          Prior Functioning/Environment              Frequency  Min 1X/week        Progress Toward Goals  OT Goals(current goals can now be found in the care plan section)  Progress towards OT goals: Progressing toward goals  Acute Rehab OT Goals Patient Stated Goal: return to ALF OT Goal Formulation: With patient Time For Goal Achievement: 11/06/22 Potential to Achieve Goals: Good  Plan Discharge plan remains appropriate;Frequency remains appropriate    Co-evaluation                 AM-PAC OT "6 Clicks" Daily Activity     Outcome Measure   Help from another person eating meals?: None Help from another person taking care of personal grooming?: A Little Help from another person toileting, which includes using toliet, bedpan, or urinal?: A Little Help from another person bathing (including washing, rinsing, drying)?: A Little Help from another person to put on and taking off regular upper body clothing?: A Little Help from another person to put on and taking off regular lower body clothing?: A Lot 6 Click Score: 18    End of Session Equipment Utilized During Treatment: Gait belt;Rolling walker (2 wheels)  OT Visit Diagnosis: Unsteadiness on feet (R26.81);Muscle weakness (generalized)  (M62.81);History of falling (Z91.81);Pain Pain - Right/Left:  (bilateral) Pain - part of body: Leg   Activity Tolerance Patient tolerated treatment well;Patient limited by pain   Patient Left in chair;with call bell/phone within reach;with chair alarm set   Nurse Communication Mobility status;Patient requests pain meds        Time: [id number] OT Time Calculation (min): 16 min  Charges: OT General Charges $OT Visit: 1 Visit OT Treatments $Self Care/Home Management : 8-22 mins  [facility] MS, OTR/L ascom [phone]  10/25/22, 2:08 PM

## 2022-10-25 NOTE — Progress Notes (Signed)
[facility] Progress Note  Patient: Caitlin Hardin    [id number]  DOA: 10/22/2022     Date of Service: the patient was seen and examined on 10/25/2022  Chief Complaint  Patient presents with   Fever    Patient presents with LEFT lower leg redness, pain, and hot to touch (appears to be cellulitic); Patient did have a mechanical fall this morning around 05:00 when she was walking to the restroom; Ever since that fall, she had progressively worsening LEFT lower leg pain; Patient is febrile with temp of 103.1 (Tylenol 1,000 mg given); Sepsis work-up initiated   Fall   Brief hospital course: Caitlin Hardin is a 57 y.o. female with medical history significant for Class III obesity, BMI over 50, OSA on CPAP, HTN, DM, prior stroke, depression, COPD on home O2 at 2 L, CKD llla who presents to the ED after awakening with left lower extremity pain redness and swelling, which was noticed after sustaining a mechanical fall after waking at 5 AM and going to the bathroom.  She later developed a fever on her way to the hospital.   ED w/up: Tmax 103.1 with pulse 112 and respirations 23, O2 sat 95% on room air and BP 115/60.  Labs notable for WBC 21,000 with lactic acid 1.5.  Respiratory viral panel negative.CMP notable for creatinine 1.25 which is about her baseline and minor electrolyte abnormalities.  Total bilirubin 1.5.  Urinalysis mostly unremarkable. EKG, personally viewed and interpreted showing sinus tachycardia at 110 with no acute ST-T wave changes. Chest x-ray showed no focal consolidations Left lower extremity Doppler without evidence of DVT Patient was treated with sepsis fluids, started on cefepime and metronidazole and vancomycin. She was also given fluconazole for candidal rash in skin folds.  Hospitalist consulted for admission.    Assessment and Plan: Cellulitis of left lower extremity Sepsis, criteria include fever, tachypnea, soft blood pressure, leukocytosis lactic  acidosis, Lower extremity Doppler negative for DVT Continue cefepime, Flagyl and vancomycin, pharmacy consulted for dosing and Vanco trough monitoring S/p IVF for Sepsis  WBC count 21.7, elevated Pro-Cal 0.76, ESR 106 Trend WBC count, follow CRP   Candidal intertrigo Continue fluconazole started in the ED, 100 mg daily for 10 days Skin protective barriers   Chronic obstructive pulmonary disease, unspecified (HCC) Chronic respiratory failure with hypoxia Not acutely exacerbated -Continue home inhalers - DuoNebs as needed     Stage 3a chronic kidney disease ([place]) Renal function at baseline   Class 3 severe obesity with body mass index (BMI) of 50.0 to 123456 in adult  Complicating factor to overall prognosis and care   Diabetes mellitus type 2, noninsulin dependent (HCC) Sliding scale insulin coverage   Depression Continue Zoloft and bupropion   Essential hypertension Holding antihypertensives due to soft blood pressures likely related to sepsis.  Resume as appropriate   OSA (obstructive sleep apnea) CPAP nightly   Iron deficiency anemia, iron level 20, transferrin saturation 7% at lower end,  Continue oral iron supplement   Vitamin D insufficiency, vitamin D level 25, started oral supplement.   Body mass index is 52.86 kg/m.  Interventions:       Diet: Heart healthy/diabetic diet DVT Prophylaxis: Subcutaneous Lovenox   Advance goals of care discussion: Full code  Family Communication: family was not present at bedside, at the time of interview.  The pt provided permission to discuss medical plan with the family. Opportunity was given to ask question and all questions were answered satisfactorily.  Disposition:  Pt is from ALF, admitted with sepsis due to LLE cellulitis, still on IV Abx, which precludes a safe discharge. Discharge to ALF with [facility] PT/OT, when clinically stable.  May need few days to improve.  Subjective: No significant events overnight,  patient overall feels improvement but is still has pain and redness in the left lower extremity.  Denies any other complaints.    Physical Exam: General: NAD, lying comfortably Appear in no distress, affect appropriate Eyes: PERRLA ENT: Oral Mucosa Clear, moist  Neck: no JVD,  Cardiovascular: S1 and S2 Present, no Murmur,  Respiratory: good respiratory effort, Bilateral Air entry equal and Decreased, no Crackles, no wheezes Abdomen: Bowel Sound present, Soft and no tenderness,  Skin: no rashes Extremities: LLE erythema and tenderness, mild edema. no calf tenderness Neurologic: without any new focal findings Gait not checked due to patient safety concerns  Vitals:   10/24/22 1946 10/24/22 2133 10/25/22 0726 10/25/22 0743  BP:  (!) 128/99 139/60   Pulse:  (!) 101 98   Resp:  20 17   Temp:  98.9 F (37.2 C) 98 F (36.7 C)   TempSrc:      SpO2: 91% 98% 94% 95%  Weight:      Height:        Intake/Output Summary (Last 24 hours) at 10/25/2022 1333 Last data filed at 10/25/2022 0153 Gross per 24 hour  Intake 550.09 ml  Output --  Net 550.09 ml   Filed Weights   10/22/22 1830 10/22/22 2212  Weight: 116.1 kg 118.7 kg    Data Reviewed: I have personally reviewed and interpreted daily labs, tele strips, imagings as discussed above. I reviewed all nursing notes, pharmacy notes, vitals, pertinent old records I have discussed plan of care as described above with RN and patient/family.  CBC: Recent Labs  Lab 10/22/22 1831 10/23/22 0413 10/24/22 0407 10/25/22 0404  WBC 21.0* 18.5* 15.7* 21.7*  NEUTROABS 17.6*  --   --   --   HGB 12.2 10.6* 10.4* 10.8*  HCT 37.3 33.2* 31.5* 33.4*  MCV 94.9 96.2 93.5 93.3  PLT 143* 140* 165 [id number]   Basic Metabolic Panel: Recent Labs  Lab 10/22/22 1831 10/23/22 0407 10/23/22 0413 10/24/22 0407 10/25/22 0404  NA 131*  --  136 137 136  K 4.9  --  3.7 3.6 3.8  CL 97*  --  102 104 101  CO2 25  --  25 22 23   GLUCOSE 153*  --  128* 121*  107*  BUN 17  --  15 13 8   CREATININE 1.25*  --  1.08* 0.86 0.73  CALCIUM 8.7*  --  8.6* 8.5* 8.9  MG  --  1.9  --  2.1 2.1  PHOS  --  2.9  --  3.1 3.5    Studies: No results found.  Scheduled Meds:  buPROPion  100 mg Oral BID   calcium carbonate  1 tablet Oral BID WC   docusate sodium  100 mg Oral BID   enoxaparin (LOVENOX) injection  0.5 mg/kg Subcutaneous Q24H   ferrous sulfate  325 mg Oral BID   fluconazole  100 mg Oral Daily   gabapentin  300 mg Oral BID   insulin aspart  0-20 Units Subcutaneous TID WC   insulin aspart  0-5 Units Subcutaneous QHS   ipratropium-albuterol  3 mL Nebulization BID   latanoprost  1 drop Both Eyes QHS   liver oil-zinc oxide  1 Application Topical q morning  magnesium oxide  400 mg Oral BID   oxybutynin  5 mg Oral BID   pantoprazole  40 mg Oral Daily   sertraline  25 mg Oral Daily   Vitamin D (Ergocalciferol)  50,000 Units Oral Q7 days   Continuous Infusions:  ceFEPime (MAXIPIME) IV 2 g (10/25/22 0622)   metronidazole 500 mg (10/25/22 0804)   vancomycin Stopped (10/24/22 1904)   PRN Meds: acetaminophen **OR** acetaminophen, albuterol, HYDROcodone-acetaminophen, ketorolac, morphine injection, ondansetron **OR** ondansetron (ZOFRAN) IV  Time spent: 35 minutes  Author: Val Riles. MD [facility] 10/25/2022 1:33 PM  To reach On-call, see care teams to locate the attending and reach out to them via [email or web address]. If 7PM-7AM, please contact night-coverage If you still have difficulty reaching the attending provider, please page the [facility] (Director on Call) for [facility] on amion for assistance.

## 2022-10-25 NOTE — Plan of Care (Signed)
  Problem: Education: Goal: Ability to describe self-care measures that may prevent or decrease complications (Diabetes Survival Skills Education) will improve Outcome: Progressing   Problem: Coping: Goal: Ability to adjust to condition or change in health will improve Outcome: Progressing   Problem: Metabolic: Goal: Ability to maintain appropriate glucose levels will improve Outcome: Progressing   Problem: Skin Integrity: Goal: Risk for impaired skin integrity will decrease Outcome: Progressing   Problem: Tissue Perfusion: Goal: Adequacy of tissue perfusion will improve Outcome: Progressing   Problem: Fluid Volume: Goal: Hemodynamic stability will improve Outcome: Progressing   Problem: Clinical Measurements: Goal: Diagnostic test results will improve Outcome: Progressing

## 2022-10-25 NOTE — Care Management Important Message (Signed)
Important Message  Patient Details  Name: Caitlin Hardin MRN: [id number] Date of Birth: 01/27/1966   Medicare Important Message Given:  N/A - LOS <3 / Initial given by admissions     Dannette Barbara 10/25/2022, 8:51 AM

## 2022-10-26 DIAGNOSIS — L03116 Cellulitis of left lower limb: Secondary | ICD-10-CM | POA: Diagnosis not present

## 2022-10-26 LAB — VANCOMYCIN, TROUGH: Vancomycin Tr: 7 ug/mL — ABNORMAL LOW (ref 15–20)

## 2022-10-26 LAB — CBC
HCT: 30.9 % — ABNORMAL LOW (ref 36.0–46.0)
Hemoglobin: 10 g/dL — ABNORMAL LOW (ref 12.0–15.0)
MCH: 30.5 pg (ref 26.0–34.0)
MCHC: 32.4 g/dL (ref 30.0–36.0)
MCV: 94.2 fL (ref 80.0–100.0)
Platelets: 209 10*3/uL (ref 150–400)
RBC: 3.28 MIL/uL — ABNORMAL LOW (ref 3.87–5.11)
RDW: 14.3 % (ref 11.5–15.5)
WBC: 18.9 10*3/uL — ABNORMAL HIGH (ref 4.0–10.5)
nRBC: 0 % (ref 0.0–0.2)

## 2022-10-26 LAB — BASIC METABOLIC PANEL
Anion gap: 9 (ref 5–15)
BUN: 7 mg/dL (ref 6–20)
CO2: 25 mmol/L (ref 22–32)
Calcium: 8.5 mg/dL — ABNORMAL LOW (ref 8.9–10.3)
Chloride: 103 mmol/L (ref 98–111)
Creatinine, Ser: 0.64 mg/dL (ref 0.44–1.00)
GFR, Estimated: >60 mL/min (ref >60)
Glucose, Bld: 118 mg/dL — ABNORMAL HIGH (ref 70–99)
Potassium: 3.5 mmol/L (ref 3.5–5.1)
Sodium: 137 mmol/L (ref 135–145)

## 2022-10-26 LAB — GLUCOSE, CAPILLARY
Glucose-Capillary: 116 mg/dL — ABNORMAL HIGH (ref 70–99)
Glucose-Capillary: 103 mg/dL — ABNORMAL HIGH (ref 70–99)
Glucose-Capillary: 119 mg/dL — ABNORMAL HIGH (ref 70–99)
Glucose-Capillary: 118 mg/dL — ABNORMAL HIGH (ref 70–99)

## 2022-10-26 LAB — URIC ACID: Uric Acid, Serum: 3.7 mg/dL (ref 2.5–7.1)

## 2022-10-26 MED ORDER — SODIUM CHLORIDE 0.9 % IV SOLN
2.0000 g | INTRAVENOUS | Status: DC
  Administered 2022-10-26 – 2022-11-01 (×7): 2 g via INTRAVENOUS
  Filled 2022-10-26 (×3): qty 2
  Filled 2022-10-26: qty 20
  Filled 2022-10-26 (×3): qty 2

## 2022-10-26 MED ORDER — KETOROLAC TROMETHAMINE 15 MG/ML IJ SOLN: 15.0000 mg | Freq: Three times a day (TID) | INTRAMUSCULAR | Status: AC | PRN

## 2022-10-26 MED ORDER — KETOROLAC TROMETHAMINE 15 MG/ML IJ SOLN
15.0000 mg | Freq: Two times a day (BID) | INTRAMUSCULAR | Status: AC
  Administered 2022-10-27 (×2): 15 mg via INTRAVENOUS
  Filled 2022-10-26 (×2): qty 1

## 2022-10-26 MED ORDER — KETOROLAC TROMETHAMINE 15 MG/ML IJ SOLN
15.0000 mg | Freq: Three times a day (TID) | INTRAMUSCULAR | Status: AC
  Administered 2022-10-26 (×2): 15 mg via INTRAVENOUS
  Filled 2022-10-26 (×2): qty 1

## 2022-10-26 NOTE — Progress Notes (Signed)
[facility] Progress Note  Patient: Caitlin Hardin    [id number]  DOA: 10/22/2022     Date of Service: the patient was seen and examined on 10/26/2022  Chief Complaint  Patient presents with   Fever    Patient presents with LEFT lower leg redness, pain, and hot to touch (appears to be cellulitic); Patient did have a mechanical fall this morning around 05:00 when she was walking to the restroom; Ever since that fall, she had progressively worsening LEFT lower leg pain; Patient is febrile with temp of 103.1 (Tylenol 1,000 mg given); Sepsis work-up initiated   Fall   Brief hospital course: Caitlin Hardin is a 57 y.o. female with medical history significant for Class III obesity, BMI over 50, OSA on CPAP, HTN, DM, prior stroke, depression, COPD on home O2 at 2 L, CKD llla who presents to the ED after awakening with left lower extremity pain redness and swelling, which was noticed after sustaining a mechanical fall after waking at 5 AM and going to the bathroom.  She later developed a fever on her way to the hospital.   ED w/up: Tmax 103.1 with pulse 112 and respirations 23, O2 sat 95% on room air and BP 115/60.  Labs notable for WBC 21,000 with lactic acid 1.5.  Respiratory viral panel negative.CMP notable for creatinine 1.25 which is about her baseline and minor electrolyte abnormalities.  Total bilirubin 1.5.  Urinalysis mostly unremarkable. EKG, personally viewed and interpreted showing sinus tachycardia at 110 with no acute ST-T wave changes. Chest x-ray showed no focal consolidations Left lower extremity Doppler without evidence of DVT Patient was treated with sepsis fluids, started on cefepime and metronidazole and vancomycin. She was also given fluconazole for candidal rash in skin folds.  Hospitalist consulted for admission.    Assessment and Plan: Cellulitis of left lower extremity Sepsis, criteria include fever, tachypnea, soft blood pressure, leukocytosis lactic  acidosis, Lower extremity Doppler negative for DVT S/p cefepime and Flagyl, DC'd on 4/4 and started ceftriaxone 2 g IV daily Continue vancomycin, pharmacy consulted for dosing and Vanco trough monitoring S/p IVF for Sepsis  WBC count 21.7---18.9 down Pro-Cal 0.76, ESR 106, CRP 35.7 elevated 4/4 started Toradol twice daily for 2 days followed by as needed Trend WBC count   Candidal intertrigo Continue fluconazole started in the ED, 100 mg daily for 10 days Skin protective barriers   Chronic obstructive pulmonary disease, unspecified (HCC) Chronic respiratory failure with hypoxia Not acutely exacerbated -Continue home inhalers - DuoNebs as needed     Stage 3a chronic kidney disease ([place]) Renal function at baseline   Class 3 severe obesity with body mass index (BMI) of 50.0 to 123456 in adult  Complicating factor to overall prognosis and care   Diabetes mellitus type 2, noninsulin dependent (HCC) Sliding scale insulin coverage   Depression Continue Zoloft and bupropion   Essential hypertension Holding antihypertensives due to soft blood pressures likely related to sepsis.  Resume as appropriate   OSA (obstructive sleep apnea) CPAP nightly   Iron deficiency anemia, iron level 20, transferrin saturation 7% at lower end,  Continue oral iron supplement   Vitamin D insufficiency, vitamin D level 25, started oral supplement.   Body mass index is 52.86 kg/m.  Interventions:       Diet: Heart healthy/diabetic diet DVT Prophylaxis: Subcutaneous Lovenox   Advance goals of care discussion: Full code  Family Communication: family was not present at bedside, at the time of interview.  The  pt provided permission to discuss medical plan with the family. Opportunity was given to ask question and all questions were answered satisfactorily.   Disposition:  Pt is from ALF, admitted with sepsis due to LLE cellulitis, still on IV Abx, which precludes a safe discharge. Discharge  to ALF with [facility] PT/OT, when clinically stable.  May need few days to improve.  Subjective: No significant events overnight, still planing of severe pain in the left lower extremity which is off and on and also having difficulty walking.  Cellulitis is gradually improving, denies any other active issues.   Physical Exam: General: NAD, lying comfortably Appear in no distress, affect appropriate Eyes: PERRLA ENT: Oral Mucosa Clear, moist  Neck: no JVD,  Cardiovascular: S1 and S2 Present, no Murmur,  Respiratory: good respiratory effort, Bilateral Air entry equal and Decreased, no Crackles, no wheezes Abdomen: Bowel Sound present, Soft and no tenderness,  Skin: no rashes Extremities: LLE erythema and tenderness, mild edema. no calf tenderness Neurologic: without any new focal findings Gait not checked due to patient safety concerns  Vitals:   10/25/22 2214 10/26/22 0747 10/26/22 0749 10/26/22 1432  BP: (!) 151/62 136/63    Pulse:  86  87  Resp: 18 17  17   Temp: 98.2 F (36.8 C) 98.4 F (36.9 C)  98.3 F (36.8 C)  TempSrc: Oral     SpO2: 99% 94% 93% 94%  Weight:      Height:        Intake/Output Summary (Last 24 hours) at 10/26/2022 1457 Last data filed at 10/26/2022 0347 Gross per 24 hour  Intake 100 ml  Output 500 ml  Net -400 ml   Filed Weights   10/22/22 1830 10/22/22 2212  Weight: 116.1 kg 118.7 kg    Data Reviewed: I have personally reviewed and interpreted daily labs, tele strips, imagings as discussed above. I reviewed all nursing notes, pharmacy notes, vitals, pertinent old records I have discussed plan of care as described above with RN and patient/family.  CBC: Recent Labs  Lab 10/22/22 1831 10/23/22 0413 10/24/22 0407 10/25/22 0404 10/26/22 0442  WBC 21.0* 18.5* 15.7* 21.7* 18.9*  NEUTROABS 17.6*  --   --   --   --   HGB 12.2 10.6* 10.4* 10.8* 10.0*  HCT 37.3 33.2* 31.5* 33.4* 30.9*  MCV 94.9 96.2 93.5 93.3 94.2  PLT 143* 140* 165 203 [id number]   Basic  Metabolic Panel: Recent Labs  Lab 10/22/22 1831 10/23/22 0407 10/23/22 0413 10/24/22 0407 10/25/22 0404 10/26/22 0442  NA 131*  --  136 137 136 137  K 4.9  --  3.7 3.6 3.8 3.5  CL 97*  --  102 104 101 103  CO2 25  --  25 22 23 25   GLUCOSE 153*  --  128* 121* 107* 118*  BUN 17  --  15 13 8 7   CREATININE 1.25*  --  1.08* 0.86 0.73 0.64  CALCIUM 8.7*  --  8.6* 8.5* 8.9 8.5*  MG  --  1.9  --  2.1 2.1  --   PHOS  --  2.9  --  3.1 3.5  --     Studies: CT TIBIA FIBULA LEFT W CONTRAST  Result Date: 10/25/2022 CLINICAL DATA:  Soft tissue infection suspected. Patient presents with left lower leg redness pain. EXAM: CT OF THE LOWER LEFT EXTREMITY WITH CONTRAST TECHNIQUE: Multidetector CT imaging of the lower left extremity was performed according to the standard protocol following intravenous contrast administration. RADIATION  DOSE REDUCTION: This exam was performed according to the departmental dose-optimization program which includes automated exposure control, adjustment of the mA and/or kV according to patient size and/or use of iterative reconstruction technique. CONTRAST:  185mL OMNIPAQUE IOHEXOL 300 MG/ML  SOLN COMPARISON:  None Available. FINDINGS: Bones/Joint/Cartilage No evidence of fracture or osseous lesion. No cortical erosion or periosteal reaction. Medial tibiofemoral joint space narrowing with marginal osteophytes suggesting moderate osteoarthritis. Ligaments Suboptimally assessed by CT. Muscles and Tendons Muscles are normal in bulk. No intramuscular hematoma or fluid collection. Tendons of the flexor, extensor and peroneal compartments are intact. Achilles tendon is intact. Mild Achilles enthesopathy. Soft tissues Skin thickening and subcutaneous soft tissue edema prominent about the lateral aspect of the knee and leg. The findings are consistent with cellulitis. No fluid collection or abscess. IMPRESSION: 1. Skin thickening and subcutaneous soft tissue edema prominent about the lateral  aspect of the knee and leg. The findings are consistent with cellulitis. No fluid collection or abscess. 2. No evidence of fracture or osseous lesion. 3. Muscles and tendons are intact. Electronically Signed   By: Keane Police D.O.   On: 10/25/2022 21:37    Scheduled Meds:  buPROPion  100 mg Oral BID   calcium carbonate  1 tablet Oral BID WC   docusate sodium  100 mg Oral BID   enoxaparin (LOVENOX) injection  0.5 mg/kg Subcutaneous Q24H   ferrous sulfate  325 mg Oral BID   fluconazole  100 mg Oral Daily   gabapentin  300 mg Oral BID   insulin aspart  0-20 Units Subcutaneous TID WC   insulin aspart  0-5 Units Subcutaneous QHS   ipratropium-albuterol  3 mL Nebulization BID   ketorolac  15 mg Intravenous Q8H   Followed by   Derrill Memo ON 10/27/2022] ketorolac  15 mg Intravenous Q12H   latanoprost  1 drop Both Eyes QHS   liver oil-zinc oxide  1 Application Topical q morning   magnesium oxide  400 mg Oral BID   oxybutynin  5 mg Oral BID   pantoprazole  40 mg Oral Daily   polyethylene glycol  17 g Oral Daily   sertraline  25 mg Oral Daily   Vitamin D (Ergocalciferol)  50,000 Units Oral Q7 days   Continuous Infusions:  cefTRIAXone (ROCEPHIN)  IV 2 g (10/26/22 1421)   vancomycin 1,250 mg (10/25/22 1741)   PRN Meds: acetaminophen **OR** acetaminophen, albuterol, bisacodyl, HYDROcodone-acetaminophen, ketorolac **FOLLOWED BY** [START ON 10/27/2022] ketorolac **FOLLOWED BY** [START ON 10/28/2022] ketorolac, morphine injection, ondansetron **OR** ondansetron (ZOFRAN) IV  Time spent: 35 minutes  Author: Val Riles. MD [facility] 10/26/2022 2:57 PM  To reach On-call, see care teams to locate the attending and reach out to them via [email or web address]. If 7PM-7AM, please contact night-coverage If you still have difficulty reaching the attending provider, please page the [facility] (Director on Call) for [facility] on amion for assistance.

## 2022-10-26 NOTE — Progress Notes (Signed)
Physical Therapy Treatment Patient Details Name: Caitlin Hardin MRN: [id number] DOB: 10-Jan-1966 Today's Date: 10/26/2022   History of Present Illness Pt is a 57 y.o. female with medical history significant for Class III obesity, BMI over 50, OSA on CPAP, HTN, DM, prior stroke, depression, COPD on home O2 at 2 L, CKD llla who presents to the ED after awakening with left lower extremity pain redness and swelling, which was noticed after sustaining a mechanical fall after waking at 5 AM and going to the bathroom.  She later developed a fever on her way to the hospital.  MD assessment includes: cellulitis of left lower extremity, sepsis, candidal intertrigo, and iron deficiency anemia.    PT Comments    Pt received in bed, very motivated to participate. Reliant on [facility] being raised and side rail to transfer to edge of bed. Pt with good sitting balance, vc's for PLB technique and not holding breath during transfers. Pt easily SOB upon exertion, no pulse ox available. Pt is on 2L O2 at home. Good demonstration of gait training to/from bathroom with RW and CG/S despite 9/10 L lower leg pain. Pt left sitting upright and reclined in chair with all needs met. Continue PT per POC.   Recommendations for follow up therapy are one component of a multi-disciplinary discharge planning process, led by the attending physician.  Recommendations may be updated based on patient status, additional functional criteria and insurance authorization.  Follow Up Recommendations       Assistance Recommended at Discharge Intermittent Supervision/Assistance  Patient can return home with the following A little help with walking and/or transfers;A little help with bathing/dressing/bathroom;Assistance with cooking/housework;Direct supervision/assist for medications management;Assist for transportation   Equipment Recommendations  Rolling walker (2 wheels)    Recommendations for Other Services       Precautions /  Restrictions Precautions Precautions: Fall Restrictions Weight Bearing Restrictions: No     Mobility  Bed Mobility Overal bed mobility: Needs Assistance Bed Mobility: Supine to Sit     Supine to sit: Min assist, HOB elevated     General bed mobility comments:  (Increased time for bed mobility)    Transfers Overall transfer level: Needs assistance Equipment used: Rolling walker (2 wheels) Transfers: Sit to/from Stand Sit to Stand: Supervision           General transfer comment:  (Min/ModA from low toilet seat)    Ambulation/Gait Ambulation/Gait assistance: Min guard Gait Distance (Feet): 20 Feet Assistive device: Rolling walker (2 wheels) Gait Pattern/deviations: Step-to pattern, Antalgic, Decreased step length - right, Decreased stance time - left, Step-through pattern Gait velocity: decreased     General Gait Details: Mix of step-to and step-through patterns with slow cadence and short B step length but steady without LOB or buckling   Stairs             Wheelchair Mobility    Modified Rankin (Stroke Patients Only)       Balance Overall balance assessment: Needs assistance Sitting-balance support: Feet supported Sitting balance-Leahy Scale: Good     Standing balance support: Single extremity supported, During functional activity, Reliant on assistive device for balance Standing balance-Leahy Scale: Fair Standing balance comment:  (Pt able to take a few forward steps without AD or LOB)                            Cognition Arousal/Alertness: Awake/alert Behavior During Therapy: WFL for tasks assessed/performed Overall Cognitive Status: Within  Functional Limits for tasks assessed                                 General Comments: very pleasant and motivated        Exercises General Exercises - Lower Extremity Ankle Circles/Pumps: AROM, Both Quad Sets: 15 reps Long Arc Quad: AROM, Both, 10 reps Other  Exercises Other Exercises:  (Education provided on PLB technique. Pt on 2L O2 at baseline, fatigues upon minimal exertion.)    General Comments General comments (skin integrity, edema, etc.): Left lower leg remains slightly edematous and red in color, ice pack applied in reclined sitting      Pertinent Vitals/Pain Pain Assessment Pain Assessment: 0-10 Pain Score: 9  Pain Location: LLE Pain Descriptors / Indicators: Aching, Sore Pain Intervention(s): Limited activity within patient's tolerance, Patient requesting pain meds-RN notified, Ice applied    Home Living                          Prior Function            PT Goals (current goals can now be found in the care plan section) Acute Rehab PT Goals Patient Stated Goal: To get stronger Progress towards PT goals: Progressing toward goals    Frequency    Min 2X/week      PT Plan Current plan remains appropriate    Co-evaluation              AM-PAC PT "6 Clicks" Mobility   Outcome Measure  Help needed turning from your back to your side while in a flat bed without using bedrails?: A Little Help needed moving from lying on your back to sitting on the side of a flat bed without using bedrails?: A Little Help needed moving to and from a bed to a chair (including a wheelchair)?: A Little Help needed standing up from a chair using your arms (e.g., wheelchair or bedside chair)?: A Little Help needed to walk in hospital room?: A Little Help needed climbing 3-5 steps with a railing? : A Little 6 Click Score: 18    End of Session Equipment Utilized During Treatment: Gait belt Activity Tolerance: Patient tolerated treatment well Patient left: with call bell/phone within reach;in chair;with chair alarm set Nurse Communication: Mobility status PT Visit Diagnosis: Unsteadiness on feet (R26.81);History of falling (Z91.81);Difficulty in walking, not elsewhere classified (R26.2);Muscle weakness (generalized)  (M62.81);Pain Pain - Right/Left: Left Pain - part of body: Leg;Knee     Time: [id number] PT Time Calculation (min) (ACUTE ONLY): 27 min  Charges:  $Gait Training: 8-22 mins $Therapeutic Exercise: 8-22 mins                    [name], PTA   [name] 10/26/2022, 1:59 PM

## 2022-10-26 NOTE — Plan of Care (Signed)

## 2022-10-26 NOTE — Consult Note (Signed)
Pharmacy Antibiotic Note  Caitlin Hardin is a 57 y.o. female with a medical history significant for Class III obesity, BMI over 50, OSA on CPAP, HTN, DM, prior stroke, depression, COPD on home O2 at 2L, CKD IIIa, who was admitted on 10/22/2022 with cellulitis. Pharmacy has been consulted for vancomycin dosing.   Plan: Continue Vancomycin 1250 mg IV every 24 hours Will obtain a vancomycin trough before 1800 dose on 10/26/22 Cefepime and Flagyl discontinued. Start ceftriaxone 2 grams IV every 24 hours Follow renal function and levels for changes   Height: 4\' 11"  (149.9 cm) Weight: 118.7 kg (261 lb 11.2 oz) IBW/kg (Calculated) : 43.2  Temp (24hrs), Avg:98.2 F (36.8 C), Min:98.1 F (36.7 C), Max:98.4 F (36.9 C)  Recent Labs  Lab 10/22/22 1831 10/22/22 1937 10/23/22 0413 10/24/22 0407 10/25/22 0404 10/26/22 0442  WBC 21.0*  --  18.5* 15.7* 21.7* 18.9*  CREATININE 1.25*  --  1.08* 0.86 0.73 0.64  LATICACIDVEN 1.9 1.5  --   --   --   --      Estimated Creatinine Clearance: 89.9 mL/min (by C-G formula based on SCr of 0.64 mg/dL).    No Known Allergies  Antimicrobials this admission: Vancomycin 3/31 >>  Ceftriaxone 4/4>> Cefepime 3/31 >>4/4 Flagyl 3/31 >>4/4  Dose adjustments this admission: N/A  Microbiology results: 3/31 BCx: NGTD 3/31 UCx: >100,000 CFU/mL staph simulans (pan-sensitive) 3/31 MRSA PCR: negative  3/31 Respiratory panel: negative   Thank you for allowing pharmacy to be a part of this patient's care.  Lorin Picket, PharmD 10/26/2022 11:15 AM

## 2022-10-27 DIAGNOSIS — L03116 Cellulitis of left lower limb: Secondary | ICD-10-CM | POA: Diagnosis not present

## 2022-10-27 LAB — GLUCOSE, CAPILLARY
Glucose-Capillary: 108 mg/dL — ABNORMAL HIGH (ref 70–99)
Glucose-Capillary: 105 mg/dL — ABNORMAL HIGH (ref 70–99)
Glucose-Capillary: 120 mg/dL — ABNORMAL HIGH (ref 70–99)
Glucose-Capillary: 116 mg/dL — ABNORMAL HIGH (ref 70–99)

## 2022-10-27 LAB — CULTURE, BLOOD (ROUTINE X 2)
Culture: NO GROWTH
Culture: NO GROWTH
Special Requests: ADEQUATE

## 2022-10-27 LAB — BASIC METABOLIC PANEL
Anion gap: 8 (ref 5–15)
BUN: 7 mg/dL (ref 6–20)
CO2: 27 mmol/L (ref 22–32)
Calcium: 8.5 mg/dL — ABNORMAL LOW (ref 8.9–10.3)
Chloride: 106 mmol/L (ref 98–111)
Creatinine, Ser: 0.7 mg/dL (ref 0.44–1.00)
GFR, Estimated: >60 mL/min (ref >60)
Glucose, Bld: 102 mg/dL — ABNORMAL HIGH (ref 70–99)
Potassium: 3.6 mmol/L (ref 3.5–5.1)
Sodium: 141 mmol/L (ref 135–145)

## 2022-10-27 LAB — CBC
HCT: 30.6 % — ABNORMAL LOW (ref 36.0–46.0)
Hemoglobin: 9.6 g/dL — ABNORMAL LOW (ref 12.0–15.0)
MCH: 29.7 pg (ref 26.0–34.0)
MCHC: 31.4 g/dL (ref 30.0–36.0)
MCV: 94.7 fL (ref 80.0–100.0)
Platelets: 242 10*3/uL (ref 150–400)
RBC: 3.23 MIL/uL — ABNORMAL LOW (ref 3.87–5.11)
RDW: 14.4 % (ref 11.5–15.5)
WBC: 15.7 10*3/uL — ABNORMAL HIGH (ref 4.0–10.5)
nRBC: 0 % (ref 0.0–0.2)

## 2022-10-27 MED ORDER — VANCOMYCIN HCL 1750 MG/350ML IV SOLN
1750.0000 mg | INTRAVENOUS | Status: DC
  Administered 2022-10-27: 1750 mg via INTRAVENOUS
  Filled 2022-10-27 (×2): qty 350

## 2022-10-27 NOTE — Progress Notes (Signed)
[facility] Progress Note  Patient: Caitlin Hardin    ZOX:[id number]  DOA: 10/22/2022     Date of Service: the patient was seen and examined on 10/27/2022  Chief Complaint  Patient presents with   Fever    Patient presents with LEFT lower leg redness, pain, and hot to touch (appears to be cellulitic); Patient did have a mechanical fall this morning around 05:00 when she was walking to the restroom; Ever since that fall, she had progressively worsening LEFT lower leg pain; Patient is febrile with temp of 103.1 (Tylenol 1,000 mg given); Sepsis work-up initiated   Fall   Brief hospital course: Caitlin Hardin is a 57 y.o. female with medical history significant for Class III obesity, BMI over 50, OSA on CPAP, HTN, DM, prior stroke, depression, COPD on home O2 at 2 L, CKD llla who presents to the ED after awakening with left lower extremity pain redness and swelling, which was noticed after sustaining a mechanical fall after waking at 5 AM and going to the bathroom.  She later developed a fever on her way to the hospital.   ED w/up: Tmax 103.1 with pulse 112 and respirations 23, O2 sat 95% on room air and BP 115/60.  Labs notable for WBC 21,000 with lactic acid 1.5.  Respiratory viral panel negative.CMP notable for creatinine 1.25 which is about her baseline and minor electrolyte abnormalities.  Total bilirubin 1.5.  Urinalysis mostly unremarkable. EKG, personally viewed and interpreted showing sinus tachycardia at 110 with no acute ST-T wave changes. Chest x-ray showed no focal consolidations Left lower extremity Doppler without evidence of DVT Patient was treated with sepsis fluids, started on cefepime and metronidazole and vancomycin. She was also given fluconazole for candidal rash in skin folds.  Hospitalist consulted for admission.    Assessment and Plan: Cellulitis of left lower extremity Sepsis, criteria include fever, tachypnea, soft blood pressure, leukocytosis lactic  acidosis, Lower extremity Doppler negative for DVT S/p cefepime and Flagyl, DC'd on 4/4 and started ceftriaxone 2 g IV daily Continue vancomycin, pharmacy consulted for dosing and Vanco trough monitoring S/p IVF for Sepsis  WBC count 21.7---15.7 trending down Pro-Cal 0.76, ESR 106, CRP 35.7 elevated 4/4 started Toradol twice daily for 2 days, followed by prn Trend WBC count   UTI, urine culture growing staph stimulants, pansensitive. Continue ceftriaxone.   Candidal intertrigo Continue fluconazole started in the ED, 100 mg daily for 10 days Skin protective barriers   Chronic obstructive pulmonary disease, unspecified (HCC) Chronic respiratory failure with hypoxia Not acutely exacerbated -Continue home inhalers - DuoNebs as needed     Stage 3a chronic kidney disease (HCC) Renal function at baseline   Class 3 severe obesity with body mass index (BMI) of 50.0 to 59.9 in adult  Complicating factor to overall prognosis and care   Diabetes mellitus type 2, noninsulin dependent (HCC) Sliding scale insulin coverage   Depression Continue Zoloft and bupropion   Essential hypertension Holding antihypertensives due to soft blood pressures likely related to sepsis.  Resume as appropriate   OSA (obstructive sleep apnea) CPAP nightly   Iron deficiency anemia, iron level 20, transferrin saturation 7% at lower end,  Continue oral iron supplement  Vitamin D insufficiency, vitamin D level 25, started oral supplement.   Body mass index is 52.86 kg/m.  Interventions:       Diet: Heart healthy/diabetic diet DVT Prophylaxis: Subcutaneous Lovenox   Advance goals of care discussion: Full code  Family Communication: family was not  present at bedside, at the time of interview.  The pt provided permission to discuss medical plan with the family. Opportunity was given to ask question and all questions were answered satisfactorily.   Disposition:  Pt is from ALF, admitted with  sepsis due to LLE cellulitis, still on IV Abx, which precludes a safe discharge. Discharge to ALF with [facility] PT/OT, when clinically stable.  May need few days to improve.  Subjective: No significant events overnight, patient still complaining of left lower renal pain 8/10, feels slightly improved edema and tenderness.  Denies any active issues, resting comfortably.  Physical Exam: General: NAD, lying comfortably Appear in no distress, affect appropriate Eyes: PERRLA ENT: Oral Mucosa Clear, moist  Neck: no JVD,  Cardiovascular: S1 and S2 Present, no Murmur,  Respiratory: good respiratory effort, Bilateral Air entry equal and Decreased, no Crackles, no wheezes Abdomen: Bowel Sound present, Soft and no tenderness,  Skin: no rashes Extremities: LLE erythema and tenderness, mild edema. no calf tenderness Neurologic: without any new focal findings Gait not checked due to patient safety concerns  Vitals:   10/27/22 0803 10/27/22 0943 10/27/22 1239 10/27/22 1553  BP: (!) 153/89 (!) 138/54 125/85 (!) 140/49  Pulse: 88 87 95 75  Resp:    16  Temp: 97.9 F (36.6 C) 97.6 F (36.4 C) 98 F (36.7 C) 97.8 F (36.6 C)  TempSrc: Oral Oral Oral   SpO2: 96% 97% 98% 98%  Weight:      Height:        Intake/Output Summary (Last 24 hours) at 10/27/2022 1612 Last data filed at 10/27/2022 0516 Gross per 24 hour  Intake 600.09 ml  Output --  Net 600.09 ml   Filed Weights   10/22/22 1830 10/22/22 2212  Weight: 116.1 kg 118.7 kg    Data Reviewed: I have personally reviewed and interpreted daily labs, tele strips, imagings as discussed above. I reviewed all nursing notes, pharmacy notes, vitals, pertinent old records I have discussed plan of care as described above with RN and patient/family.  CBC: Recent Labs  Lab 10/22/22 1831 10/23/22 0413 10/24/22 0407 10/25/22 0404 10/26/22 0442 10/27/22 0414  WBC 21.0* 18.5* 15.7* 21.7* 18.9* 15.7*  NEUTROABS 17.6*  --   --   --   --   --   HGB 12.2  10.6* 10.4* 10.8* 10.0* 9.6*  HCT 37.3 33.2* 31.5* 33.4* 30.9* 30.6*  MCV 94.9 96.2 93.5 93.3 94.2 94.7  PLT 143* 140* 165 203 209 242   Basic Metabolic Panel: Recent Labs  Lab 10/23/22 0407 10/23/22 0413 10/24/22 0407 10/25/22 0404 10/26/22 0442 10/27/22 0414  NA  --  136 137 136 137 141  K  --  3.7 3.6 3.8 3.5 3.6  CL  --  102 104 101 103 106  CO2  --  25 22 23 25 27   GLUCOSE  --  128* 121* 107* 118* 102*  BUN  --  15 13 8 7 7   CREATININE  --  1.08* 0.86 0.73 0.64 0.70  CALCIUM  --  8.6* 8.5* 8.9 8.5* 8.5*  MG 1.9  --  2.1 2.1  --   --   PHOS 2.9  --  3.1 3.5  --   --     Studies: No results found.  Scheduled Meds:  buPROPion  100 mg Oral BID   calcium carbonate  1 tablet Oral BID WC   docusate sodium  100 mg Oral BID   enoxaparin (LOVENOX) injection  0.5 mg/kg  Subcutaneous Q24H   ferrous sulfate  325 mg Oral BID   fluconazole  100 mg Oral Daily   gabapentin  300 mg Oral BID   insulin aspart  0-20 Units Subcutaneous TID WC   insulin aspart  0-5 Units Subcutaneous QHS   ketorolac  15 mg Intravenous Q12H   latanoprost  1 drop Both Eyes QHS   liver oil-zinc oxide  1 Application Topical q morning   magnesium oxide  400 mg Oral BID   oxybutynin  5 mg Oral BID   pantoprazole  40 mg Oral Daily   polyethylene glycol  17 g Oral Daily   sertraline  25 mg Oral Daily   Vitamin D (Ergocalciferol)  50,000 Units Oral Q7 days   Continuous Infusions:  cefTRIAXone (ROCEPHIN)  IV 2 g (10/27/22 1343)   vancomycin     PRN Meds: acetaminophen **OR** acetaminophen, albuterol, bisacodyl, HYDROcodone-acetaminophen, [COMPLETED] ketorolac **FOLLOWED BY** ketorolac **FOLLOWED BY** [START ON 10/28/2022] ketorolac, morphine injection, ondansetron **OR** ondansetron (ZOFRAN) IV  Time spent: 35 minutes  Author: Gillis SantaILEEP Lexander Tremblay. MD [facility] 10/27/2022 4:12 PM  To reach On-call, see care teams to locate the attending and reach out to them via [email or web address]. If 7PM-7AM, please contact  night-coverage If you still have difficulty reaching the attending provider, please page the [facility] (Director on Call) for [facility] on amion for assistance.

## 2022-10-27 NOTE — Care Management Important Message (Signed)
Important Message  Patient Details  Name: Caitlin Hardin MRN: [id number] Date of Birth: 05/05/1966   Medicare Important Message Given:  Yes     Johnell Comings 10/27/2022, 10:58 AM

## 2022-10-27 NOTE — Progress Notes (Signed)
Physical Therapy Treatment Patient Details Name: Caitlin Hardin MRN: [id number] DOB: [birth year] Today's Date: 10/27/2022   History of Present Illness Pt is a 57 y.o. female with medical history significant for Class III obesity, BMI over 50, OSA on CPAP, HTN, DM, prior stroke, depression, COPD on home O2 at 2 L, CKD llla who presents to the ED after awakening with left lower extremity pain redness and swelling, which was noticed after sustaining a mechanical fall after waking at 5 AM and going to the bathroom.  She later developed a fever on her way to the hospital.  MD assessment includes: cellulitis of left lower extremity, sepsis, candidal intertrigo, and iron deficiency anemia.    PT Comments    Patient received in recliner, she is agreeable to PT session. Patient stands with mod I. She ambulated 100 feet with RW,  seated rest after ~50 feet. Supervision to min guard. O2 sats in upper 80%s on 2 liters. Increased O2 to 3 liters while ambulating. Patient making good progress and will continue to benefit from skilled PT to improve strength and safety with mobility.     Recommendations for follow up therapy are one component of a multi-disciplinary discharge planning process, led by the attending physician.  Recommendations may be updated based on patient status, additional functional criteria and insurance authorization.  Follow Up Recommendations       Assistance Recommended at Discharge Intermittent Supervision/Assistance  Patient can return home with the following A little help with walking and/or transfers;A little help with bathing/dressing/bathroom;Assistance with cooking/housework;Direct supervision/assist for medications management;Assist for transportation   Equipment Recommendations  Rolling walker (2 wheels)    Recommendations for Other Services       Precautions / Restrictions Precautions Precautions: Fall Restrictions Weight Bearing Restrictions: No     Mobility  Bed  Mobility               General bed mobility comments: NT patient up in recliner    Transfers Overall transfer level: Modified independent Equipment used: Rolling walker (2 wheels) Transfers: Sit to/from Stand Sit to Stand: Modified independent (Device/Increase time)                Ambulation/Gait Ambulation/Gait assistance: Supervision Gait Distance (Feet): 100 Feet Assistive device: Rolling walker (2 wheels) Gait Pattern/deviations: Step-through pattern Gait velocity: decreased     General Gait Details: patient ambulated with slight antalgic gait due to sore L LE. Steady without LOB. O2 sats down to 80%s with ambulation on 2 liters. Increased O2 while walking to 3 liters.   Stairs             Wheelchair Mobility    Modified Rankin (Stroke Patients Only)       Balance Overall balance assessment: Needs assistance Sitting-balance support: Feet supported Sitting balance-Leahy Scale: Good     Standing balance support: Bilateral upper extremity supported, During functional activity, Reliant on assistive device for balance Standing balance-Leahy Scale: Fair Standing balance comment: no LOB                            Cognition Arousal/Alertness: Awake/alert Behavior During Therapy: WFL for tasks assessed/performed Overall Cognitive Status: Within Functional Limits for tasks assessed                                 General Comments: very pleasant and motivated  Exercises      General Comments        Pertinent Vitals/Pain Pain Assessment Pain Assessment: Faces Faces Pain Scale: Hurts a little bit Pain Location: LLE Pain Descriptors / Indicators: Aching, Sore    Home Living                          Prior Function            PT Goals (current goals can now be found in the care plan section) Acute Rehab PT Goals Patient Stated Goal: To get stronger PT Goal Formulation: With patient Time For  Goal Achievement: 11/05/22 Potential to Achieve Goals: Good Progress towards PT goals: Progressing toward goals    Frequency    Min 2X/week      PT Plan Current plan remains appropriate    Co-evaluation              AM-PAC PT "6 Clicks" Mobility   Outcome Measure  Help needed turning from your back to your side while in a flat bed without using bedrails?: A Little Help needed moving from lying on your back to sitting on the side of a flat bed without using bedrails?: A Little Help needed moving to and from a bed to a chair (including a wheelchair)?: A Little Help needed standing up from a chair using your arms (e.g., wheelchair or bedside chair)?: A Little Help needed to walk in hospital room?: A Little Help needed climbing 3-5 steps with a railing? : A Little 6 Click Score: 18    End of Session Equipment Utilized During Treatment: Oxygen Activity Tolerance: Patient tolerated treatment well Patient left: in chair;with call bell/phone within reach Nurse Communication: Mobility status PT Visit Diagnosis: History of falling (Z91.81);Difficulty in walking, not elsewhere classified (R26.2);Muscle weakness (generalized) (M62.81);Pain Pain - Right/Left: Left Pain - part of body: Leg     Time: [id number] PT Time Calculation (min) (ACUTE ONLY): 20 min  Charges:  $Gait Training: 8-22 mins                     Timur Nibert, PT, GCS 10/27/22,1:01 PM

## 2022-10-27 NOTE — TOC Progression Note (Signed)
Transition of Care [facility]) - Progression Note    Patient Details  Name: Caitlin Hardin MRN: [id number] Date of Birth: [birth year]  Transition of [facility]) CM/SW Contact  Allena Katz, LCSW Phone Number: 10/27/2022, 11:37 AM  Clinical Narrative:   MD states pt to discharge Monday. CSW continuing to follow. Pt confirmed RW was delivered to room by [employer].     Expected Discharge Plan:  (Pending workup) Barriers to Discharge: No Barriers Identified  Expected Discharge Plan and Services   Discharge Planning Services: CM Consult   Living arrangements for the past 2 months: Assisted Living Facility (Patient a resident from [facility])                                       Social Determinants of Health (SDOH) Interventions SDOH Screenings   Food Insecurity: No Food Insecurity (10/22/2022)  Housing: Low Risk  (10/22/2022)  Transportation Needs: No Transportation Needs (10/22/2022)  Utilities: Not At Risk (10/22/2022)  Tobacco Use: Low Risk  (10/22/2022)    Readmission Risk Interventions    03/27/2021   12:52 PM  Readmission Risk Prevention Plan  Post Dischage Appt Complete  Medication Screening Complete  Transportation Screening Complete

## 2022-10-27 NOTE — Consult Note (Signed)
Pharmacy Antibiotic Note  Caitlin Hardin is a 57 y.o. female with a medical history significant for Class III obesity, BMI over 50, OSA on CPAP, HTN, DM, prior stroke, depression, COPD on home O2 at 2L, CKD IIIa, who was admitted on 10/22/2022 with cellulitis. Pharmacy has been consulted for vancomycin dosing.   Plan: Vancomycin trough = 7 ug/ml prior to 5th dose was subtherapeutic. T1/2 ~ 12 hours Change Vancomycin to 1750 mg IV every 24 hours Estimated AUC 510, Cmin ~10 Continue ceftriaxone 2 grams IV every 24 hours Follow renal function and future levels for adjustment    Height: 4\' 11"  (149.9 cm) Weight: 118.7 kg (261 lb 11.2 oz) IBW/kg (Calculated) : 43.2  Temp (24hrs), Avg:98.2 F (36.8 C), Min:98 F (36.7 C), Max:98.3 F (36.8 C)  Recent Labs  Lab 10/22/22 1831 10/22/22 1937 10/23/22 0413 10/24/22 0407 10/25/22 0404 10/26/22 0442 10/26/22 1729 10/27/22 0414  WBC 21.0*  --  18.5* 15.7* 21.7* 18.9*  --  15.7*  CREATININE 1.25*  --  1.08* 0.86 0.73 0.64  --  0.70  LATICACIDVEN 1.9 1.5  --   --   --   --   --   --   VANCOTROUGH  --   --   --   --   --   --  7*  --      Estimated Creatinine Clearance: 89.9 mL/min (by C-G formula based on SCr of 0.7 mg/dL).    No Known Allergies  Antimicrobials this admission: Vancomycin 3/31 >>  Ceftriaxone 4/4>> Cefepime 3/31 >>4/4 Flagyl 3/31 >>4/4  Dose adjustments this admission: N/A  Microbiology results: 3/31 BCx: NGTD 3/31 UCx: >100,000 CFU/mL staph simulans (pan-sensitive) 3/31 MRSA PCR: negative  3/31 Respiratory panel: negative   Thank you for allowing pharmacy to be a part of this patient's care.  Barrie Folk, PharmD 10/27/2022 8:32 AM

## 2022-10-28 ENCOUNTER — Encounter: Payer: Self-pay | Admitting: Internal Medicine

## 2022-10-28 DIAGNOSIS — L03116 Cellulitis of left lower limb: Secondary | ICD-10-CM | POA: Diagnosis not present

## 2022-10-28 LAB — CBC
HCT: 30.1 % — ABNORMAL LOW (ref 36.0–46.0)
Hemoglobin: 9.7 g/dL — ABNORMAL LOW (ref 12.0–15.0)
MCH: 30.7 pg (ref 26.0–34.0)
MCHC: 32.2 g/dL (ref 30.0–36.0)
MCV: 95.3 fL (ref 80.0–100.0)
Platelets: 258 10*3/uL (ref 150–400)
RBC: 3.16 MIL/uL — ABNORMAL LOW (ref 3.87–5.11)
RDW: 14.5 % (ref 11.5–15.5)
WBC: 14.2 10*3/uL — ABNORMAL HIGH (ref 4.0–10.5)
nRBC: 0 % (ref 0.0–0.2)

## 2022-10-28 LAB — FERRITIN: Ferritin: 548 ng/mL — ABNORMAL HIGH (ref 11–307)

## 2022-10-28 LAB — BASIC METABOLIC PANEL
Anion gap: 11 (ref 5–15)
BUN: 7 mg/dL (ref 6–20)
CO2: 27 mmol/L (ref 22–32)
Calcium: 8.4 mg/dL — ABNORMAL LOW (ref 8.9–10.3)
Chloride: 102 mmol/L (ref 98–111)
Creatinine, Ser: 0.52 mg/dL (ref 0.44–1.00)
GFR, Estimated: >60 mL/min (ref >60)
Glucose, Bld: 111 mg/dL — ABNORMAL HIGH (ref 70–99)
Potassium: 3.7 mmol/L (ref 3.5–5.1)
Sodium: 140 mmol/L (ref 135–145)

## 2022-10-28 LAB — GLUCOSE, CAPILLARY
Glucose-Capillary: 132 mg/dL — ABNORMAL HIGH (ref 70–99)
Glucose-Capillary: 98 mg/dL (ref 70–99)
Glucose-Capillary: 142 mg/dL — ABNORMAL HIGH (ref 70–99)
Glucose-Capillary: 106 mg/dL — ABNORMAL HIGH (ref 70–99)

## 2022-10-28 NOTE — Progress Notes (Addendum)
Progress Note    Caitlin Hardin  ONG:[id number] DOB: [birth year]  DOA: 10/22/2022 PCP: Ellan Lambert, NP      Brief Narrative:    Medical records reviewed and are as summarized below:  Caitlin Hardin is a 57 y.o. female  with medical history significant for Class III obesity, BMI over 50, OSA on CPAP, HTN, DM, prior stroke, depression, COPD on home O2 at 2 L, CKD llla who presents to the ED after awakening with left lower extremity pain redness and swelling, which was noticed after sustaining a mechanical fall after waking at 5 AM and going to the bathroom.  She later developed a fever on her way to the hospital.       Assessment/Plan:   Active Problems:   Cellulitis of left lower extremity   Sepsis   Candidal intertrigo   OSA (obstructive sleep apnea)   Essential hypertension   Depression   Diabetes mellitus type 2, noninsulin dependent   Class 3 severe obesity with body mass index (BMI) of 50.0 to 59.9 in adult   Stage 3a chronic kidney disease   Chronic obstructive pulmonary disease, unspecified   Chronic respiratory failure with hypoxia   Body mass index is 52.86 kg/m.  (Morbid obesity)   Sepsis secondary to left lower extremity cellulitis, leukocytosis: WBC is slowly trending down.  Initially treated with IV cefepime, vancomycin and Flagyl.  Discontinue IV vancomycin.  Continue IV ceftriaxone.  Analgesics as needed for pain.   Probable UTI: Urine culture showed Staphylococcus simulans.  Significance unclear.  However, patient reported dysuria.  She has had IV vancomycin for 5 days and this should be sufficient.   Candidal intertrigo, abdominal skin folds: Continue fluconazole.   COPD with chronic hypoxic respiratory failure: Continue bronchodilators and 2 L/min oxygen via nasal cannula.   Vitamin D deficiency: Continue vitamin D supplement   Other comorbidities include type II DM, depression, hypertension, CKD stage IIIa, OSA on CPAP, iron  deficiency anemia,    Diet Order             Diet heart healthy/carb modified Room service appropriate? Yes; Fluid consistency: Thin  Diet effective now                            Consultants: None  Procedures: None    Medications:    buPROPion  100 mg Oral BID   calcium carbonate  1 tablet Oral BID WC   docusate sodium  100 mg Oral BID   enoxaparin (LOVENOX) injection  0.5 mg/kg Subcutaneous Q24H   ferrous sulfate  325 mg Oral BID   fluconazole  100 mg Oral Daily   gabapentin  300 mg Oral BID   insulin aspart  0-20 Units Subcutaneous TID WC   insulin aspart  0-5 Units Subcutaneous QHS   latanoprost  1 drop Both Eyes QHS   liver oil-zinc oxide  1 Application Topical q morning   magnesium oxide  400 mg Oral BID   oxybutynin  5 mg Oral BID   pantoprazole  40 mg Oral Daily   polyethylene glycol  17 g Oral Daily   sertraline  25 mg Oral Daily   Vitamin D (Ergocalciferol)  50,000 Units Oral Q7 days   Continuous Infusions:  cefTRIAXone (ROCEPHIN)  IV 200 mL/hr at 10/27/22 1901   vancomycin 175 mL/hr at 10/27/22 1901     Anti-infectives (From admission, onward)    Start  Dose/Rate Route Frequency Ordered Stop   10/27/22 1800  vancomycin (VANCOREADY) IVPB 1750 mg/350 mL        1,750 mg 175 mL/hr over 120 Minutes Intravenous Every 24 hours 10/27/22 0847     10/26/22 1400  cefTRIAXone (ROCEPHIN) 2 g in sodium chloride 0.9 % 100 mL IVPB        2 g 200 mL/hr over 30 Minutes Intravenous Every 24 hours 10/26/22 1106     10/24/22 1800  vancomycin (VANCOREADY) IVPB 1250 mg/250 mL  Status:  Discontinued        1,250 mg 166.7 mL/hr over 90 Minutes Intravenous Every 24 hours 10/24/22 1321 10/27/22 0847   10/23/22 1800  vancomycin (VANCOCIN) IVPB 1000 mg/200 mL premix  Status:  Discontinued        1,000 mg 200 mL/hr over 60 Minutes Intravenous Every 24 hours 10/22/22 2132 10/24/22 1321   10/23/22 1400  ceFEPIme (MAXIPIME) 2 g in sodium chloride 0.9 % 100 mL  IVPB  Status:  Discontinued        2 g 200 mL/hr over 30 Minutes Intravenous Every 8 hours 10/23/22 0909 10/26/22 1105   10/23/22 0700  metroNIDAZOLE (FLAGYL) IVPB 500 mg  Status:  Discontinued        500 mg 100 mL/hr over 60 Minutes Intravenous Every 12 hours 10/22/22 2106 10/26/22 1109   10/23/22 0700  ceFEPIme (MAXIPIME) 2 g in sodium chloride 0.9 % 100 mL IVPB  Status:  Discontinued        2 g 200 mL/hr over 30 Minutes Intravenous Every 12 hours 10/22/22 2131 10/23/22 0909   10/22/22 2145  vancomycin (VANCOREADY) IVPB 500 mg/100 mL        500 mg 100 mL/hr over 60 Minutes Intravenous  Once 10/22/22 2131 10/23/22 0117   10/22/22 2115  fluconazole (DIFLUCAN) tablet 100 mg        100 mg Oral Daily 10/22/22 2106 10/31/22 0959   10/22/22 1845  vancomycin (VANCOREADY) IVPB 2000 mg/400 mL        2,000 mg 200 mL/hr over 120 Minutes Intravenous  Once 10/22/22 1832 10/22/22 2143   10/22/22 1830  ceFEPIme (MAXIPIME) 2 g in sodium chloride 0.9 % 100 mL IVPB        2 g 200 mL/hr over 30 Minutes Intravenous  Once 10/22/22 1826 10/22/22 1944   10/22/22 1830  metroNIDAZOLE (FLAGYL) IVPB 500 mg        500 mg 100 mL/hr over 60 Minutes Intravenous  Once 10/22/22 1826 10/22/22 2007   10/22/22 1830  vancomycin (VANCOCIN) IVPB 1000 mg/200 mL premix  Status:  Discontinued        1,000 mg 200 mL/hr over 60 Minutes Intravenous  Once 10/22/22 1826 10/22/22 1832   10/22/22 1830  fluconazole (DIFLUCAN) tablet 150 mg        150 mg Oral  Once 10/22/22 1826 10/22/22 1857              Family Communication/Anticipated D/C date and plan/Code Status   DVT prophylaxis:      Code Status: Full Code  Family Communication: Plan discussed with Nellie, HPOA, over speaker phone in patient's room Disposition Plan: Plan to discharge home in 2 to 3 days   Status is: Inpatient Remains inpatient appropriate because: IV antibiotics for left leg cellulitis       Subjective:   Interval events noted.  She  complains of pain in the left leg.  She also reports painful urination but it is  better.  No chest pain or shortness of breath.  Objective:    Vitals:   10/27/22 1239 10/27/22 1553 10/27/22 2354 10/28/22 0735  BP: 125/85 (!) 140/49 138/71 (!) 140/96  Pulse: 95 75 97 88  Resp:  16 18 18   Temp: 98 F (36.7 C) 97.8 F (36.6 C) 98.1 F (36.7 C) 98.3 F (36.8 C)  TempSrc: Oral     SpO2: 98% 98% 94% 93%  Weight:      Height:       No data found.   Intake/Output Summary (Last 24 hours) at 10/28/2022 1209 Last data filed at 10/28/2022 0300 Gross per 24 hour  Intake 689.99 ml  Output --  Net 689.99 ml   Filed Weights   10/22/22 1830 10/22/22 2212  Weight: 116.1 kg 118.7 kg    Exam:  GEN: NAD SKIN: Warm and dry EYES: No pallor ENT: MMM CV: RRR PULM: CTA B ABD: soft, obese, NT, +BS CNS: AAO x 3, non focal EXT: Left leg swelling, tenderness and erythema.        Data Reviewed:   I have personally reviewed following labs and imaging studies:  Labs: Labs show the following:   Basic Metabolic Panel: Recent Labs  Lab 10/23/22 0407 10/23/22 0413 10/24/22 0407 10/25/22 0404 10/26/22 0442 10/27/22 0414 10/28/22 0427  NA  --    < > 137 136 137 141 140  K  --    < > 3.6 3.8 3.5 3.6 3.7  CL  --    < > 104 101 103 106 102  CO2  --    < > 22 23 25 27 27   GLUCOSE  --    < > 121* 107* 118* 102* 111*  BUN  --    < > 13 8 7 7 7   CREATININE  --    < > 0.86 0.73 0.64 0.70 0.52  CALCIUM  --    < > 8.5* 8.9 8.5* 8.5* 8.4*  MG 1.9  --  2.1 2.1  --   --   --   PHOS 2.9  --  3.1 3.5  --   --   --    < > = values in this interval not displayed.   GFR Estimated Creatinine Clearance: 89.9 mL/min (by C-G formula based on SCr of 0.52 mg/dL). Liver Function Tests: Recent Labs  Lab 10/22/22 1831  AST 34  ALT 18  ALKPHOS 86  BILITOT 1.5*  PROT 8.0  ALBUMIN 3.2*   No results for input(s): "LIPASE", "AMYLASE" in the last 168 hours. No results for input(s): "AMMONIA" in  the last 168 hours. Coagulation profile Recent Labs  Lab 10/22/22 1944 10/23/22 0413  INR 1.3* 1.3*    CBC: Recent Labs  Lab 10/22/22 1831 10/23/22 0413 10/24/22 0407 10/25/22 0404 10/26/22 0442 10/27/22 0414 10/28/22 0427  WBC 21.0*   < > 15.7* 21.7* 18.9* 15.7* 14.2*  NEUTROABS 17.6*  --   --   --   --   --   --   HGB 12.2   < > 10.4* 10.8* 10.0* 9.6* 9.7*  HCT 37.3   < > 31.5* 33.4* 30.9* 30.6* 30.1*  MCV 94.9   < > 93.5 93.3 94.2 94.7 95.3  PLT 143*   < > 165 203 209 242 258   < > = values in this interval not displayed.   Cardiac Enzymes: No results for input(s): "CKTOTAL", "CKMB", "CKMBINDEX", "TROPONINI" in the last 168 hours. BNP (last 3  results) No results for input(s): "PROBNP" in the last 8760 hours. CBG: Recent Labs  Lab 10/27/22 1151 10/27/22 1630 10/27/22 2144 10/28/22 0737 10/28/22 1140  GLUCAP 105* 120* 116* 132* 98   D-Dimer: No results for input(s): "DDIMER" in the last 72 hours. Hgb A1c: No results for input(s): "HGBA1C" in the last 72 hours. Lipid Profile: No results for input(s): "CHOL", "HDL", "LDLCALC", "TRIG", "CHOLHDL", "LDLDIRECT" in the last 72 hours. Thyroid function studies: No results for input(s): "TSH", "T4TOTAL", "T3FREE", "THYROIDAB" in the last 72 hours.  Invalid input(s): "FREET3" Anemia work up: Recent Labs    10/28/22 0425  FERRITIN 548*   Sepsis Labs: Recent Labs  Lab 10/22/22 1831 10/22/22 1937 10/23/22 0413 10/24/22 0407 10/25/22 0404 10/26/22 0442 10/27/22 0414 10/28/22 0427  PROCALCITON  --   --  1.96  --  0.76  --   --   --   WBC 21.0*  --  18.5*   < > 21.7* 18.9* 15.7* 14.2*  LATICACIDVEN 1.9 1.5  --   --   --   --   --   --    < > = values in this interval not displayed.    Microbiology Recent Results (from the past 240 hour(s))  Blood culture (routine x 2)     Status: None   Collection Time: 10/22/22  6:31 PM   Specimen: BLOOD  Result Value Ref Range Status   Specimen Description BLOOD BLOOD  RIGHT FOREARM  Final   Special Requests   Final    BOTTLES DRAWN AEROBIC AND ANAEROBIC Blood Culture adequate volume   Culture   Final    NO GROWTH 5 DAYS Performed at [facility], [address] [place], [place] [address]    Report Status 10/27/2022 FINAL  Final  Resp panel by RT-PCR (RSV, Flu A&B, Covid) Anterior Nasal Swab     Status: None   Collection Time: 10/22/22  6:31 PM   Specimen: Anterior Nasal Swab  Result Value Ref Range Status   SARS Coronavirus 2 by RT PCR NEGATIVE NEGATIVE Final    Comment: (NOTE) SARS-CoV-2 target nucleic acids are NOT DETECTED.  The SARS-CoV-2 RNA is generally detectable in upper respiratory specimens during the acute phase of infection. The lowest concentration of SARS-CoV-2 viral copies this assay can detect is 138 copies/mL. A negative result does not preclude SARS-Cov-2 infection and should not be used as the sole basis for treatment or other patient management decisions. A negative result may occur with  improper specimen collection/handling, submission of specimen other than nasopharyngeal swab, presence of viral mutation(s) within the areas targeted by this assay, and inadequate number of viral copies(<138 copies/mL). A negative result must be combined with clinical observations, patient history, and epidemiological information. The expected result is Negative.  Fact Sheet for Patients:  BloggerCourse.com  Fact Sheet for Healthcare Providers:  SeriousBroker.it  This test is no t yet approved or cleared by the [place] FDA and  has been authorized for detection and/or diagnosis of SARS-CoV-2 by FDA under an Emergency Use Authorization (EUA). This EUA will remain  in effect (meaning this test can be used) for the duration of the COVID-19 declaration under Section 564(b)(1) of the Act, 21 U.S.C.section 360bbb-3(b)(1), unless the authorization is terminated  or revoked  sooner.       Influenza A by PCR NEGATIVE NEGATIVE Final   Influenza B by PCR NEGATIVE NEGATIVE Final    Comment: (NOTE) The Xpert Xpress SARS-CoV-2/FLU/RSV plus assay is intended  as an aid in the diagnosis of influenza from Nasopharyngeal swab specimens and should not be used as a sole basis for treatment. Nasal washings and aspirates are unacceptable for Xpert Xpress SARS-CoV-2/FLU/RSV testing.  Fact Sheet for Patients: BloggerCourse.com  Fact Sheet for Healthcare Providers: SeriousBroker.it  This test is not yet approved or cleared by the [place] FDA and has been authorized for detection and/or diagnosis of SARS-CoV-2 by FDA under an Emergency Use Authorization (EUA). This EUA will remain in effect (meaning this test can be used) for the duration of the COVID-19 declaration under Section 564(b)(1) of the Act, 21 U.S.C. section 360bbb-3(b)(1), unless the authorization is terminated or revoked.     Resp Syncytial Virus by PCR NEGATIVE NEGATIVE Final    Comment: (NOTE) Fact Sheet for Patients: BloggerCourse.com  Fact Sheet for Healthcare Providers: SeriousBroker.it  This test is not yet approved or cleared by the [place] FDA and has been authorized for detection and/or diagnosis of SARS-CoV-2 by FDA under an Emergency Use Authorization (EUA). This EUA will remain in effect (meaning this test can be used) for the duration of the COVID-19 declaration under Section 564(b)(1) of the Act, 21 U.S.C. section 360bbb-3(b)(1), unless the authorization is terminated or revoked.  Performed at [facility], [address] [place], [place] [address]   Blood culture (routine x 2)     Status: None   Collection Time: 10/22/22  7:44 PM   Specimen: BLOOD RIGHT HAND  Result Value Ref Range Status   Specimen Description BLOOD RIGHT HAND  Final   Special Requests    Final    BOTTLES DRAWN AEROBIC AND ANAEROBIC Blood Culture results may not be optimal due to an excessive volume of blood received in culture bottles   Culture   Final    NO GROWTH 5 DAYS Performed at [facility], [address] [place], [place] [address]    Report Status 10/27/2022 FINAL  Final  Urine Culture (for pregnant, neutropenic or urologic patients or patients with an indwelling urinary catheter)     Status: Abnormal   Collection Time: 10/22/22  7:53 PM   Specimen: Urine, Clean Catch  Result Value Ref Range Status   Specimen Description   Final    URINE, CLEAN CATCH Performed at [facility], [address] [place], [place] [address]    Special Requests   Final    NONE Performed at [facility], [address] [place], [place] [address]    Culture >=100,000 COLONIES/mL STAPHYLOCOCCUS SIMULANS (A)  Final   Report Status 10/25/2022 FINAL  Final   Organism ID, Bacteria STAPHYLOCOCCUS SIMULANS (A)  Final      Susceptibility   Staphylococcus simulans - MIC*    CIPROFLOXACIN <=0.5 SENSITIVE Sensitive     GENTAMICIN <=0.5 SENSITIVE Sensitive     NITROFURANTOIN <=16 SENSITIVE Sensitive     OXACILLIN <=0.25 SENSITIVE Sensitive     TETRACYCLINE <=1 SENSITIVE Sensitive     VANCOMYCIN <=0.5 SENSITIVE Sensitive     TRIMETH/SULFA <=10 SENSITIVE Sensitive     CLINDAMYCIN <=0.25 SENSITIVE Sensitive     RIFAMPIN <=0.5 SENSITIVE Sensitive     Inducible Clindamycin NEGATIVE Sensitive     * >=100,000 COLONIES/mL STAPHYLOCOCCUS SIMULANS  MRSA Next Gen by PCR, Nasal     Status: None   Collection Time: 10/22/22 10:50 PM   Specimen: Nasal Mucosa; Nasal Swab  Result Value Ref Range Status   MRSA by PCR Next Gen NOT DETECTED NOT DETECTED Final  Comment: (NOTE) The GeneXpert MRSA Assay (FDA approved for NASAL specimens only), is one component of a comprehensive MRSA colonization surveillance program. It is not intended to diagnose MRSA infection nor to  guide or monitor treatment for MRSA infections. Test performance is not FDA approved in patients less than 70 years old. Performed at [facility], [address] [place], [place] [address]     Procedures and diagnostic studies:  No results found.             LOS: 6 days   Christhoper Busbee  [facility]   Pager on [email or web address]. If 7PM-7AM, please contact night-coverage at [email or web address]     10/28/2022, 12:09 PM

## 2022-10-28 NOTE — Plan of Care (Signed)
  Problem: Education: Goal: Ability to describe self-care measures that may prevent or decrease complications (Diabetes Survival Skills Education) will improve Outcome: Progressing   Problem: Skin Integrity: Goal: Risk for impaired skin integrity will decrease Outcome: Progressing   Problem: Nutrition: Goal: Adequate nutrition will be maintained Outcome: Progressing   Problem: Safety: Goal: Ability to remain free from injury will improve Outcome: Progressing   Problem: Skin Integrity: Goal: Risk for impaired skin integrity will decrease Outcome: Progressing   

## 2022-10-29 DIAGNOSIS — L03116 Cellulitis of left lower limb: Secondary | ICD-10-CM | POA: Diagnosis not present

## 2022-10-29 LAB — CBC
HCT: 29.7 % — ABNORMAL LOW (ref 36.0–46.0)
Hemoglobin: 9.5 g/dL — ABNORMAL LOW (ref 12.0–15.0)
MCH: 30.4 pg (ref 26.0–34.0)
MCHC: 32 g/dL (ref 30.0–36.0)
MCV: 95.2 fL (ref 80.0–100.0)
Platelets: 294 10*3/uL (ref 150–400)
RBC: 3.12 MIL/uL — ABNORMAL LOW (ref 3.87–5.11)
RDW: 14.6 % (ref 11.5–15.5)
WBC: 14.7 10*3/uL — ABNORMAL HIGH (ref 4.0–10.5)
nRBC: 0.1 % (ref 0.0–0.2)

## 2022-10-29 LAB — GLUCOSE, CAPILLARY
Glucose-Capillary: 114 mg/dL — ABNORMAL HIGH (ref 70–99)
Glucose-Capillary: 156 mg/dL — ABNORMAL HIGH (ref 70–99)
Glucose-Capillary: 92 mg/dL (ref 70–99)
Glucose-Capillary: 134 mg/dL — ABNORMAL HIGH (ref 70–99)

## 2022-10-29 LAB — BASIC METABOLIC PANEL
Anion gap: 6 (ref 5–15)
BUN: 10 mg/dL (ref 6–20)
CO2: 29 mmol/L (ref 22–32)
Calcium: 8.2 mg/dL — ABNORMAL LOW (ref 8.9–10.3)
Chloride: 102 mmol/L (ref 98–111)
Creatinine, Ser: 0.53 mg/dL (ref 0.44–1.00)
GFR, Estimated: >60 mL/min (ref >60)
Glucose, Bld: 114 mg/dL — ABNORMAL HIGH (ref 70–99)
Potassium: 3.9 mmol/L (ref 3.5–5.1)
Sodium: 137 mmol/L (ref 135–145)

## 2022-10-29 NOTE — Progress Notes (Signed)
Progress Note    Caitlin Hardin  JKK:[id number] DOB: June 10, 1966  DOA: 10/22/2022 PCP: Ellan Lambert, NP      Brief Narrative:    Medical records reviewed and are as summarized below:  Caitlin Hardin is a 57 y.o. female  with medical history significant for Class III obesity, BMI over 50, OSA on CPAP, HTN, DM, prior stroke, depression, COPD on home O2 at 2 L, CKD llla who presents to the ED after awakening with left lower extremity pain redness and swelling, which was noticed after sustaining a mechanical fall after waking at 5 AM and going to the bathroom.  She later developed a fever on her way to the hospital.       Assessment/Plan:   Active Problems:   Cellulitis of left lower extremity   Sepsis   Candidal intertrigo   OSA (obstructive sleep apnea)   Essential hypertension   Depression   Diabetes mellitus type 2, noninsulin dependent   Class 3 severe obesity with body mass index (BMI) of 50.0 to 59.9 in adult   Stage 3a chronic kidney disease   Chronic obstructive pulmonary disease, unspecified   Chronic respiratory failure with hypoxia   Body mass index is 52.86 kg/m.  (Morbid obesity)   Sepsis secondary to left lower extremity cellulitis, leukocytosis: WBC still elevated but overall has trended downward.  Initially treated with IV cefepime, vancomycin and Flagyl.  Continue IV ceftriaxone.  Analgesics as needed for pain.   Probable UTI: Urine culture showed Staphylococcus simulans.  Significance unclear.  However, patient reported dysuria.  She has had IV vancomycin for 5 days and this should be sufficient.   Candidal intertrigo, abdominal skin folds: Improved.  Discontinue fluconazole.   COPD with chronic hypoxic respiratory failure: Continue bronchodilators and 2 L/min oxygen via nasal cannula.   Vitamin D deficiency: Continue vitamin D supplement   Other comorbidities include type II DM, depression, hypertension, CKD stage IIIa, OSA on CPAP,  iron deficiency anemia,    Diet Order             Diet heart healthy/carb modified Room service appropriate? Yes; Fluid consistency: Thin  Diet effective now                            Consultants: None  Procedures: None    Medications:    buPROPion  100 mg Oral BID   calcium carbonate  1 tablet Oral BID WC   docusate sodium  100 mg Oral BID   enoxaparin (LOVENOX) injection  0.5 mg/kg Subcutaneous Q24H   ferrous sulfate  325 mg Oral BID   fluconazole  100 mg Oral Daily   gabapentin  300 mg Oral BID   insulin aspart  0-20 Units Subcutaneous TID WC   insulin aspart  0-5 Units Subcutaneous QHS   latanoprost  1 drop Both Eyes QHS   liver oil-zinc oxide  1 Application Topical q morning   magnesium oxide  400 mg Oral BID   oxybutynin  5 mg Oral BID   pantoprazole  40 mg Oral Daily   polyethylene glycol  17 g Oral Daily   sertraline  25 mg Oral Daily   Vitamin D (Ergocalciferol)  50,000 Units Oral Q7 days   Continuous Infusions:  cefTRIAXone (ROCEPHIN)  IV 2 g (10/28/22 1501)     Anti-infectives (From admission, onward)    Start     Dose/Rate Route Frequency Ordered  Stop   10/27/22 1800  vancomycin (VANCOREADY) IVPB 1750 mg/350 mL  Status:  Discontinued        1,750 mg 175 mL/hr over 120 Minutes Intravenous Every 24 hours 10/27/22 0847 10/28/22 1337   10/26/22 1400  cefTRIAXone (ROCEPHIN) 2 g in sodium chloride 0.9 % 100 mL IVPB        2 g 200 mL/hr over 30 Minutes Intravenous Every 24 hours 10/26/22 1106     10/24/22 1800  vancomycin (VANCOREADY) IVPB 1250 mg/250 mL  Status:  Discontinued        1,250 mg 166.7 mL/hr over 90 Minutes Intravenous Every 24 hours 10/24/22 1321 10/27/22 0847   10/23/22 1800  vancomycin (VANCOCIN) IVPB 1000 mg/200 mL premix  Status:  Discontinued        1,000 mg 200 mL/hr over 60 Minutes Intravenous Every 24 hours 10/22/22 2132 10/24/22 1321   10/23/22 1400  ceFEPIme (MAXIPIME) 2 g in sodium chloride 0.9 % 100 mL IVPB   Status:  Discontinued        2 g 200 mL/hr over 30 Minutes Intravenous Every 8 hours 10/23/22 0909 10/26/22 1105   10/23/22 0700  metroNIDAZOLE (FLAGYL) IVPB 500 mg  Status:  Discontinued        500 mg 100 mL/hr over 60 Minutes Intravenous Every 12 hours 10/22/22 2106 10/26/22 1109   10/23/22 0700  ceFEPIme (MAXIPIME) 2 g in sodium chloride 0.9 % 100 mL IVPB  Status:  Discontinued        2 g 200 mL/hr over 30 Minutes Intravenous Every 12 hours 10/22/22 2131 10/23/22 0909   10/22/22 2145  vancomycin (VANCOREADY) IVPB 500 mg/100 mL        500 mg 100 mL/hr over 60 Minutes Intravenous  Once 10/22/22 2131 10/23/22 0117   10/22/22 2115  fluconazole (DIFLUCAN) tablet 100 mg        100 mg Oral Daily 10/22/22 2106 10/31/22 0959   10/22/22 1845  vancomycin (VANCOREADY) IVPB 2000 mg/400 mL        2,000 mg 200 mL/hr over 120 Minutes Intravenous  Once 10/22/22 1832 10/22/22 2143   10/22/22 1830  ceFEPIme (MAXIPIME) 2 g in sodium chloride 0.9 % 100 mL IVPB        2 g 200 mL/hr over 30 Minutes Intravenous  Once 10/22/22 1826 10/22/22 1944   10/22/22 1830  metroNIDAZOLE (FLAGYL) IVPB 500 mg        500 mg 100 mL/hr over 60 Minutes Intravenous  Once 10/22/22 1826 10/22/22 2007   10/22/22 1830  vancomycin (VANCOCIN) IVPB 1000 mg/200 mL premix  Status:  Discontinued        1,000 mg 200 mL/hr over 60 Minutes Intravenous  Once 10/22/22 1826 10/22/22 1832   10/22/22 1830  fluconazole (DIFLUCAN) tablet 150 mg        150 mg Oral  Once 10/22/22 1826 10/22/22 1857              Family Communication/Anticipated D/C date and plan/Code Status   DVT prophylaxis:      Code Status: Full Code  Family Communication: None Disposition Plan: Plan to discharge home in 1 to 2 days   Status is: Inpatient Remains inpatient appropriate because: IV antibiotics for left leg cellulitis       Subjective:   Interval events noted.  Pain in left leg is slowly improving.  No other complaints.  Objective:     Vitals:   10/28/22 0454 10/28/22 1512 10/28/22 2354 10/29/22 0981  BP: (!) 140/96 (!) 149/53 (!) 152/73 132/66  Pulse: 88 70 82 71  Resp: 18 16 18 18   Temp: 98.3 F (36.8 C) (!) 97.4 F (36.3 C) 99 F (37.2 C) 98.1 F (36.7 C)  TempSrc:    Oral  SpO2: 93% 98% 98%   Weight:      Height:       No data found.   Intake/Output Summary (Last 24 hours) at 10/29/2022 1243 Last data filed at 10/29/2022 1001 Gross per 24 hour  Intake 960 ml  Output --  Net 960 ml   Filed Weights   10/22/22 1830 10/22/22 2212  Weight: 116.1 kg 118.7 kg    Exam  GEN: NAD SKIN: Warm and dry EYES: No pallor or icterus ENT: MMM CV: RRR PULM: CTA B ABD: soft, ND, NT, +BS CNS: AAO x 3, non focal EXT: Left leg swelling, tenderness and some erythema      Data Reviewed:   I have personally reviewed following labs and imaging studies:  Labs: Labs show the following:   Basic Metabolic Panel: Recent Labs  Lab 10/23/22 0407 10/23/22 0413 10/24/22 0407 10/25/22 0404 10/26/22 0442 10/27/22 0414 10/28/22 0427 10/29/22 0447  NA  --    < > 137 136 137 141 140 137  K  --    < > 3.6 3.8 3.5 3.6 3.7 3.9  CL  --    < > 104 101 103 106 102 102  CO2  --    < > 22 23 25 27 27 29   GLUCOSE  --    < > 121* 107* 118* 102* 111* 114*  BUN  --    < > 13 8 7 7 7 10   CREATININE  --    < > 0.86 0.73 0.64 0.70 0.52 0.53  CALCIUM  --    < > 8.5* 8.9 8.5* 8.5* 8.4* 8.2*  MG 1.9  --  2.1 2.1  --   --   --   --   PHOS 2.9  --  3.1 3.5  --   --   --   --    < > = values in this interval not displayed.   GFR Estimated Creatinine Clearance: 89.9 mL/min (by C-G formula based on SCr of 0.53 mg/dL). Liver Function Tests: Recent Labs  Lab 10/22/22 1831  AST 34  ALT 18  ALKPHOS 86  BILITOT 1.5*  PROT 8.0  ALBUMIN 3.2*   No results for input(s): "LIPASE", "AMYLASE" in the last 168 hours. No results for input(s): "AMMONIA" in the last 168 hours. Coagulation profile Recent Labs  Lab 10/22/22 1944  10/23/22 0413  INR 1.3* 1.3*    CBC: Recent Labs  Lab 10/22/22 1831 10/23/22 0413 10/25/22 0404 10/26/22 0442 10/27/22 0414 10/28/22 0427 10/29/22 0447  WBC 21.0*   < > 21.7* 18.9* 15.7* 14.2* 14.7*  NEUTROABS 17.6*  --   --   --   --   --   --   HGB 12.2   < > 10.8* 10.0* 9.6* 9.7* 9.5*  HCT 37.3   < > 33.4* 30.9* 30.6* 30.1* 29.7*  MCV 94.9   < > 93.3 94.2 94.7 95.3 95.2  PLT 143*   < > 203 209 242 258 294   < > = values in this interval not displayed.   Cardiac Enzymes: No results for input(s): "CKTOTAL", "CKMB", "CKMBINDEX", "TROPONINI" in the last 168 hours. BNP (last 3 results) No results for input(s): "PROBNP" in  the last 8760 hours. CBG: Recent Labs  Lab 10/28/22 1140 10/28/22 1720 10/28/22 2126 10/29/22 0743 10/29/22 1127  GLUCAP 98 142* 106* 114* 156*   D-Dimer: No results for input(s): "DDIMER" in the last 72 hours. Hgb A1c: No results for input(s): "HGBA1C" in the last 72 hours. Lipid Profile: No results for input(s): "CHOL", "HDL", "LDLCALC", "TRIG", "CHOLHDL", "LDLDIRECT" in the last 72 hours. Thyroid function studies: No results for input(s): "TSH", "T4TOTAL", "T3FREE", "THYROIDAB" in the last 72 hours.  Invalid input(s): "FREET3" Anemia work up: Recent Labs    10/28/22 0425  FERRITIN 548*   Sepsis Labs: Recent Labs  Lab 10/22/22 1831 10/22/22 1937 10/23/22 0413 10/24/22 0407 10/25/22 0404 10/26/22 0442 10/27/22 0414 10/28/22 0427 10/29/22 0447  PROCALCITON  --   --  1.96  --  0.76  --   --   --   --   WBC 21.0*  --  18.5*   < > 21.7* 18.9* 15.7* 14.2* 14.7*  LATICACIDVEN 1.9 1.5  --   --   --   --   --   --   --    < > = values in this interval not displayed.    Microbiology Recent Results (from the past 240 hour(s))  Blood culture (routine x 2)     Status: None   Collection Time: 10/22/22  6:31 PM   Specimen: BLOOD  Result Value Ref Range Status   Specimen Description BLOOD BLOOD RIGHT FOREARM  Final   Special Requests    Final    BOTTLES DRAWN AEROBIC AND ANAEROBIC Blood Culture adequate volume   Culture   Final    NO GROWTH 5 DAYS Performed at [facility], [address] [place], [place] [address]    Report Status 10/27/2022 FINAL  Final  Resp panel by RT-PCR (RSV, Flu A&B, Covid) Anterior Nasal Swab     Status: None   Collection Time: 10/22/22  6:31 PM   Specimen: Anterior Nasal Swab  Result Value Ref Range Status   SARS Coronavirus 2 by RT PCR NEGATIVE NEGATIVE Final    Comment: (NOTE) SARS-CoV-2 target nucleic acids are NOT DETECTED.  The SARS-CoV-2 RNA is generally detectable in upper respiratory specimens during the acute phase of infection. The lowest concentration of SARS-CoV-2 viral copies this assay can detect is 138 copies/mL. A negative result does not preclude SARS-Cov-2 infection and should not be used as the sole basis for treatment or other patient management decisions. A negative result may occur with  improper specimen collection/handling, submission of specimen other than nasopharyngeal swab, presence of viral mutation(s) within the areas targeted by this assay, and inadequate number of viral copies(<138 copies/mL). A negative result must be combined with clinical observations, patient history, and epidemiological information. The expected result is Negative.  Fact Sheet for Patients:  BloggerCourse.com  Fact Sheet for Healthcare Providers:  SeriousBroker.it  This test is no t yet approved or cleared by the [place] FDA and  has been authorized for detection and/or diagnosis of SARS-CoV-2 by FDA under an Emergency Use Authorization (EUA). This EUA will remain  in effect (meaning this test can be used) for the duration of the COVID-19 declaration under Section 564(b)(1) of the Act, 21 U.S.C.section 360bbb-3(b)(1), unless the authorization is terminated  or revoked sooner.       Influenza A by PCR NEGATIVE  NEGATIVE Final   Influenza B by PCR NEGATIVE NEGATIVE Final    Comment: (NOTE) The Xpert Xpress SARS-CoV-2/FLU/RSV plus assay  is intended as an aid in the diagnosis of influenza from Nasopharyngeal swab specimens and should not be used as a sole basis for treatment. Nasal washings and aspirates are unacceptable for Xpert Xpress SARS-CoV-2/FLU/RSV testing.  Fact Sheet for Patients: BloggerCourse.comhttps://www.fda.gov/media/152166/download  Fact Sheet for Healthcare Providers: SeriousBroker.ithttps://www.fda.gov/media/152162/download  This test is not yet approved or cleared by the [place] FDA and has been authorized for detection and/or diagnosis of SARS-CoV-2 by FDA under an Emergency Use Authorization (EUA). This EUA will remain in effect (meaning this test can be used) for the duration of the COVID-19 declaration under Section 564(b)(1) of the Act, 21 U.S.C. section 360bbb-3(b)(1), unless the authorization is terminated or revoked.     Resp Syncytial Virus by PCR NEGATIVE NEGATIVE Final    Comment: (NOTE) Fact Sheet for Patients: BloggerCourse.comhttps://www.fda.gov/media/152166/download  Fact Sheet for Healthcare Providers: SeriousBroker.ithttps://www.fda.gov/media/152162/download  This test is not yet approved or cleared by the [place] FDA and has been authorized for detection and/or diagnosis of SARS-CoV-2 by FDA under an Emergency Use Authorization (EUA). This EUA will remain in effect (meaning this test can be used) for the duration of the COVID-19 declaration under Section 564(b)(1) of the Act, 21 U.S.C. section 360bbb-3(b)(1), unless the authorization is terminated or revoked.  Performed at [facility], [address] [place], [place] [address]   Blood culture (routine x 2)     Status: None   Collection Time: 10/22/22  7:44 PM   Specimen: BLOOD RIGHT HAND  Result Value Ref Range Status   Specimen Description BLOOD RIGHT HAND  Final   Special Requests   Final    BOTTLES DRAWN AEROBIC AND ANAEROBIC  Blood Culture results may not be optimal due to an excessive volume of blood received in culture bottles   Culture   Final    NO GROWTH 5 DAYS Performed at [facility], [address] [place], [place] [address]    Report Status 10/27/2022 FINAL  Final  Urine Culture (for pregnant, neutropenic or urologic patients or patients with an indwelling urinary catheter)     Status: Abnormal   Collection Time: 10/22/22  7:53 PM   Specimen: Urine, Clean Catch  Result Value Ref Range Status   Specimen Description   Final    URINE, CLEAN CATCH Performed at [facility], [address] [place], [place] [address]    Special Requests   Final    NONE Performed at [facility], [address] [place], [place] [address]    Culture >=100,000 COLONIES/mL STAPHYLOCOCCUS SIMULANS (A)  Final   Report Status 10/25/2022 FINAL  Final   Organism ID, Bacteria STAPHYLOCOCCUS SIMULANS (A)  Final      Susceptibility   Staphylococcus simulans - MIC*    CIPROFLOXACIN <=0.5 SENSITIVE Sensitive     GENTAMICIN <=0.5 SENSITIVE Sensitive     NITROFURANTOIN <=16 SENSITIVE Sensitive     OXACILLIN <=0.25 SENSITIVE Sensitive     TETRACYCLINE <=1 SENSITIVE Sensitive     VANCOMYCIN <=0.5 SENSITIVE Sensitive     TRIMETH/SULFA <=10 SENSITIVE Sensitive     CLINDAMYCIN <=0.25 SENSITIVE Sensitive     RIFAMPIN <=0.5 SENSITIVE Sensitive     Inducible Clindamycin NEGATIVE Sensitive     * >=100,000 COLONIES/mL STAPHYLOCOCCUS SIMULANS  MRSA Next Gen by PCR, Nasal     Status: None   Collection Time: 10/22/22 10:50 PM   Specimen: Nasal Mucosa; Nasal Swab  Result Value Ref Range Status   MRSA by PCR Next Gen NOT DETECTED NOT  DETECTED Final    Comment: (NOTE) The GeneXpert MRSA Assay (FDA approved for NASAL specimens only), is one component of a comprehensive MRSA colonization surveillance program. It is not intended to diagnose MRSA infection nor to guide or monitor treatment for MRSA  infections. Test performance is not FDA approved in patients less than 52 years old. Performed at [facility], [address] [place], [place] [address]     Procedures and diagnostic studies:  No results found.             LOS: 7 days   Rufus Beske  [facility]   Pager on [email or web address]. If 7PM-7AM, please contact night-coverage at [email or web address]     10/29/2022, 12:43 PM

## 2022-10-29 NOTE — Plan of Care (Signed)
  Problem: Education: Goal: Ability to describe self-care measures that may prevent or decrease complications (Diabetes Survival Skills Education) will improve Outcome: Progressing   Problem: Skin Integrity: Goal: Risk for impaired skin integrity will decrease Outcome: Progressing   Problem: Skin Integrity: Goal: Risk for impaired skin integrity will decrease Outcome: Progressing

## 2022-10-30 ENCOUNTER — Inpatient Hospital Stay: Payer: Medicare Other

## 2022-10-30 DIAGNOSIS — L03116 Cellulitis of left lower limb: Secondary | ICD-10-CM | POA: Diagnosis not present

## 2022-10-30 LAB — BASIC METABOLIC PANEL
Anion gap: 5 (ref 5–15)
BUN: 31 mg/dL — ABNORMAL HIGH (ref 6–20)
CO2: 22 mmol/L (ref 22–32)
Calcium: 8.4 mg/dL — ABNORMAL LOW (ref 8.9–10.3)
Chloride: 111 mmol/L (ref 98–111)
Creatinine, Ser: 1.16 mg/dL — ABNORMAL HIGH (ref 0.44–1.00)
GFR, Estimated: 55 mL/min — ABNORMAL LOW (ref >60)
Glucose, Bld: 59 mg/dL — ABNORMAL LOW (ref 70–99)
Potassium: 4.6 mmol/L (ref 3.5–5.1)
Sodium: 138 mmol/L (ref 135–145)

## 2022-10-30 LAB — CBC
HCT: 32.1 % — ABNORMAL LOW (ref 36.0–46.0)
Hemoglobin: 10.2 g/dL — ABNORMAL LOW (ref 12.0–15.0)
MCH: 30.4 pg (ref 26.0–34.0)
MCHC: 31.8 g/dL (ref 30.0–36.0)
MCV: 95.5 fL (ref 80.0–100.0)
Platelets: 365 10*3/uL (ref 150–400)
RBC: 3.36 MIL/uL — ABNORMAL LOW (ref 3.87–5.11)
RDW: 14.9 % (ref 11.5–15.5)
WBC: 14.8 10*3/uL — ABNORMAL HIGH (ref 4.0–10.5)
nRBC: 0 % (ref 0.0–0.2)

## 2022-10-30 LAB — GLUCOSE, CAPILLARY
Glucose-Capillary: 105 mg/dL — ABNORMAL HIGH (ref 70–99)
Glucose-Capillary: 167 mg/dL — ABNORMAL HIGH (ref 70–99)
Glucose-Capillary: 87 mg/dL (ref 70–99)
Glucose-Capillary: 89 mg/dL (ref 70–99)
Glucose-Capillary: 109 mg/dL — ABNORMAL HIGH (ref 70–99)

## 2022-10-30 MED ORDER — SODIUM CHLORIDE 0.9 % IV SOLN: INTRAVENOUS | Status: DC

## 2022-10-30 NOTE — Progress Notes (Signed)
[facility] Progress Note  Patient: Caitlin Hardin    ZOX:[id number]  DOA: 10/22/2022     Date of Service: the patient was seen and examined on 10/30/2022  Chief Complaint  Patient presents with   Fever    Patient presents with LEFT lower leg redness, pain, and hot to touch (appears to be cellulitic); Patient did have a mechanical fall this morning around 05:00 when she was walking to the restroom; Ever since that fall, she had progressively worsening LEFT lower leg pain; Patient is febrile with temp of 103.1 (Tylenol 1,000 mg given); Sepsis work-up initiated   Fall   Brief hospital course: Caitlin Hardin is a 57 y.o. female with medical history significant for Class III obesity, BMI over 50, OSA on CPAP, HTN, DM, prior stroke, depression, COPD on home O2 at 2 L, CKD llla who presents to the ED after awakening with left lower extremity pain redness and swelling, which was noticed after sustaining a mechanical fall after waking at 5 AM and going to the bathroom.  She later developed a fever on her way to the hospital.   ED w/up: Tmax 103.1 with pulse 112 and respirations 23, O2 sat 95% on room air and BP 115/60.  Labs notable for WBC 21,000 with lactic acid 1.5.  Respiratory viral panel negative.CMP notable for creatinine 1.25 which is about her baseline and minor electrolyte abnormalities.  Total bilirubin 1.5.  Urinalysis mostly unremarkable. EKG, personally viewed and interpreted showing sinus tachycardia at 110 with no acute ST-T wave changes. Chest x-ray showed no focal consolidations Left lower extremity Doppler without evidence of DVT Patient was treated with sepsis fluids, started on cefepime and metronidazole and vancomycin. She was also given fluconazole for candidal rash in skin folds.  Hospitalist consulted for admission.    Assessment and Plan: Cellulitis of left lower extremity Sepsis, criteria include fever, tachypnea, soft blood pressure, leukocytosis lactic  acidosis, Lower extremity Doppler negative for DVT S/p cefepime and Flagyl, DC'd on 4/4 and started ceftriaxone 2 g IV daily S/p vancomycin for 5 days. S/p IVF for Sepsis  WBC count 21.7---14.8 trending down Pro-Cal 0.76, ESR 106, CRP 35.7 elevated 4/4 s/p Toradol twice daily for 2 days, continue prn Trend WBC count   UTI, urine culture growing staph stimulants, pansensitive. Continue ceftriaxone.   Candidal intertrigo, improved S/p fluconazole  and Skin protective barriers   Chronic obstructive pulmonary disease, unspecified (HCC) Chronic respiratory failure with hypoxia Not acutely exacerbated -Continue home inhalers - DuoNebs as needed     AKI, baseline cr 0.52--0.86 CKD stage 2 Cr 1.16 elevated, unknown cause could be due to dehydration versus antibiotics US renal no acute findings Later creatinine level  Class 3 severe obesity with body mass index (BMI) of 50.0 to 59.9 in adult  Complicating factor to overall prognosis and care   Diabetes mellitus type 2, noninsulin dependent (HCC) Sliding scale insulin coverage   Depression Continue Zoloft and bupropion   Essential hypertension Holding antihypertensives due to soft blood pressures likely related to sepsis.  Resume as appropriate   OSA (obstructive sleep apnea) CPAP nightly   Iron deficiency anemia, iron level 20, transferrin saturation 7% at lower end,  Continue oral iron supplement  Vitamin D insufficiency, vitamin D level 25, started oral supplement.   Body mass index is 52.86 kg/m.  Interventions:       Diet: Heart healthy/diabetic diet DVT Prophylaxis: Subcutaneous Lovenox   Advance goals of care discussion: Full code  Family Communication:  family was not present at bedside, at the time of interview.  The pt provided permission to discuss medical plan with the family. Opportunity was given to ask question and all questions were answered satisfactorily.   Disposition:  Pt is from ALF,  admitted with sepsis due to LLE cellulitis, still on IV Abx, which precludes a safe discharge. Discharge to ALF with [facility] PT/OT, when clinically stable.  May need few days to improve. 4/8 noticed elevated Cr, need to monitor another day, started IV fluid  Subjective: No significant events overnight, left lower extremity pain 2/10, feels improvement.  Denies any other complaints.  Physical Exam: General: NAD, lying comfortably Appear in no distress, affect appropriate Eyes: PERRLA ENT: Oral Mucosa Clear, moist  Neck: no JVD,  Cardiovascular: S1 and S2 Present, no Murmur,  Respiratory: good respiratory effort, Bilateral Air entry equal and Decreased, no Crackles, no wheezes Abdomen: Bowel Sound present, Soft and no tenderness,  Skin: no rashes Extremities: LLE erythema and tenderness, mild edema. no calf tenderness Neurologic: without any new focal findings Gait not checked due to patient safety concerns  Vitals:   10/29/22 2228 10/30/22 0712 10/30/22 0735 10/30/22 0748  BP: (!) 136/58 (!) 158/74 (!) 158/67 (!) 152/64  Pulse: 87 79    Resp: 20 19    Temp: 98.3 F (36.8 C) 97.8 F (36.6 C)    TempSrc:  Oral    SpO2: 98% 98%    Weight:      Height:       No intake or output data in the 24 hours ending 10/30/22 1427  Filed Weights   10/22/22 1830 10/22/22 2212  Weight: 116.1 kg 118.7 kg    Data Reviewed: I have personally reviewed and interpreted daily labs, tele strips, imagings as discussed above. I reviewed all nursing notes, pharmacy notes, vitals, pertinent old records I have discussed plan of care as described above with RN and patient/family.  CBC: Recent Labs  Lab 10/26/22 0442 10/27/22 0414 10/28/22 0427 10/29/22 0447 10/30/22 0750  WBC 18.9* 15.7* 14.2* 14.7* 14.8*  HGB 10.0* 9.6* 9.7* 9.5* 10.2*  HCT 30.9* 30.6* 30.1* 29.7* 32.1*  MCV 94.2 94.7 95.3 95.2 95.5  PLT 209 242 258 294 365   Basic Metabolic Panel: Recent Labs  Lab 10/24/22 0407  10/25/22 0404 10/26/22 0442 10/27/22 0414 10/28/22 0427 10/29/22 0447 10/30/22 0418  NA 137 136 137 141 140 137 138  K 3.6 3.8 3.5 3.6 3.7 3.9 4.6  CL 104 101 103 106 102 102 111  CO2 22 23 25 27 27 29 22   GLUCOSE 121* 107* 118* 102* 111* 114* 59*  BUN 13 8 7 7 7 10  31*  CREATININE 0.86 0.73 0.64 0.70 0.52 0.53 1.16*  CALCIUM 8.5* 8.9 8.5* 8.5* 8.4* 8.2* 8.4*  MG 2.1 2.1  --   --   --   --   --   PHOS 3.1 3.5  --   --   --   --   --     Studies: US RENAL  Result Date: 10/30/2022 CLINICAL DATA:  AKI EXAM: RENAL / URINARY TRACT ULTRASOUND COMPLETE COMPARISON:  CT renal stone protocol March 25, 2021 FINDINGS: Right Kidney: Renal measurements: 11.3 x 5.2 x 5.9 cm = volume: 180.8 mL. Echogenicity within normal limits. No mass or hydronephrosis visualized. Left Kidney: Renal measurements: 10.0 x 5.0 x 5.2 cm = volume: 138.0 mL. Echogenicity within normal limits. No mass or hydronephrosis visualized. Bladder: The bladder is not well-visualized, likely  due to underdistention. Other: None. IMPRESSION: Normal renal ultrasound. Electronically Signed   By: Jacob Moores M.D.   On: 10/30/2022 10:57    Scheduled Meds:  buPROPion  100 mg Oral BID   calcium carbonate  1 tablet Oral BID WC   docusate sodium  100 mg Oral BID   enoxaparin (LOVENOX) injection  0.5 mg/kg Subcutaneous Q24H   ferrous sulfate  325 mg Oral BID   gabapentin  300 mg Oral BID   insulin aspart  0-20 Units Subcutaneous TID WC   insulin aspart  0-5 Units Subcutaneous QHS   latanoprost  1 drop Both Eyes QHS   liver oil-zinc oxide  1 Application Topical q morning   magnesium oxide  400 mg Oral BID   oxybutynin  5 mg Oral BID   pantoprazole  40 mg Oral Daily   polyethylene glycol  17 g Oral Daily   sertraline  25 mg Oral Daily   Vitamin D (Ergocalciferol)  50,000 Units Oral Q7 days   Continuous Infusions:  cefTRIAXone (ROCEPHIN)  IV 2 g (10/29/22 1408)   PRN Meds: acetaminophen **OR** acetaminophen, albuterol,  bisacodyl, HYDROcodone-acetaminophen, [COMPLETED] ketorolac **FOLLOWED BY** [COMPLETED] ketorolac **FOLLOWED BY** ketorolac, morphine injection, ondansetron **OR** ondansetron (ZOFRAN) IV  Time spent: 35 minutes  Author: Gillis Santa. MD [facility] 10/30/2022 2:27 PM  To reach On-call, see care teams to locate the attending and reach out to them via [email or web address]. If 7PM-7AM, please contact night-coverage If you still have difficulty reaching the attending provider, please page the [email or web address] (Director on Call) for [facility] on amion for assistance.

## 2022-10-30 NOTE — Progress Notes (Signed)
Physical Therapy Treatment Patient Details Name: Caitlin Hardin MRN: [id number] DOB: 1966/02/22 Today's Date: 10/30/2022   History of Present Illness Pt is a 57 y.o. female with medical history significant for Class III obesity, BMI over 50, OSA on CPAP, HTN, DM, prior stroke, depression, COPD on home O2 at 2 L, CKD llla who presents to the ED after awakening with left lower extremity pain redness and swelling, which was noticed after sustaining a mechanical fall after waking at 5 AM and going to the bathroom.  She later developed a fever on her way to the hospital.  MD assessment includes: cellulitis of left lower extremity, sepsis, candidal intertrigo, and iron deficiency anemia.    PT Comments    Pt is making good progress towards goals with ability to ambulate in hallway using pt's own RW. Reciprocal gait pattern performed with close chair follow, however not needed. All mobility performed on 3L of O2 with sats at 90%. Still presents with L leg warmth. Will continue to progress as able.   Recommendations for follow up therapy are one component of a multi-disciplinary discharge planning process, led by the attending physician.  Recommendations may be updated based on patient status, additional functional criteria and insurance authorization.  Follow Up Recommendations       Assistance Recommended at Discharge Intermittent Supervision/Assistance  Patient can return home with the following A little help with walking and/or transfers;A little help with bathing/dressing/bathroom;Assistance with cooking/housework;Direct supervision/assist for medications management;Assist for transportation   Equipment Recommendations  Rolling walker (2 wheels)    Recommendations for Other Services       Precautions / Restrictions Precautions Precautions: Fall Restrictions Weight Bearing Restrictions: No     Mobility  Bed Mobility               General bed mobility comments: NT patient up in  recliner    Transfers Overall transfer level: Modified independent Equipment used: Rolling walker (2 wheels) Transfers: Sit to/from Stand Sit to Stand: Modified independent (Device/Increase time)           General transfer comment: pt pulls up on RW, prior to therapist cues. Shoes donned with max assist prior to transfer. Once standing, upright posture noted    Ambulation/Gait Ambulation/Gait assistance: Min guard Gait Distance (Feet): 200 Feet Assistive device: Rolling walker (2 wheels) Gait Pattern/deviations: Step-through pattern       General Gait Details: ambuluated with close chair follow, however didn't require seated rest break. RW used. Slight unsteadiness with head turns. O2 sats desats to 90% with exertion and HR at 107bpm. All mobility performed on 3L of O2.   Stairs             Wheelchair Mobility    Modified Rankin (Stroke Patients Only)       Balance Overall balance assessment: Needs assistance Sitting-balance support: Feet supported Sitting balance-Leahy Scale: Good     Standing balance support: Bilateral upper extremity supported, During functional activity, Reliant on assistive device for balance Standing balance-Leahy Scale: Fair Standing balance comment: no LOB                            Cognition Arousal/Alertness: Awake/alert Behavior During Therapy: WFL for tasks assessed/performed Overall Cognitive Status: Within Functional Limits for tasks assessed  General Comments: pleasant and agreeable to session        Exercises      General Comments        Pertinent Vitals/Pain Pain Assessment Pain Assessment: No/denies pain    Home Living                          Prior Function            PT Goals (current goals can now be found in the care plan section) Acute Rehab PT Goals Patient Stated Goal: To get stronger PT Goal Formulation: With patient Time For  Goal Achievement: 11/05/22 Potential to Achieve Goals: Good Progress towards PT goals: Progressing toward goals    Frequency    Min 2X/week      PT Plan Current plan remains appropriate    Co-evaluation              AM-PAC PT "6 Clicks" Mobility   Outcome Measure  Help needed turning from your back to your side while in a flat bed without using bedrails?: A Little Help needed moving from lying on your back to sitting on the side of a flat bed without using bedrails?: A Little Help needed moving to and from a bed to a chair (including a wheelchair)?: A Little Help needed standing up from a chair using your arms (e.g., wheelchair or bedside chair)?: A Little Help needed to walk in hospital room?: A Little Help needed climbing 3-5 steps with a railing? : A Little 6 Click Score: 18    End of Session Equipment Utilized During Treatment: Oxygen Activity Tolerance: Patient tolerated treatment well Patient left: in chair;with call bell/phone within reach Nurse Communication: Mobility status PT Visit Diagnosis: History of falling (Z91.81);Difficulty in walking, not elsewhere classified (R26.2);Muscle weakness (generalized) (M62.81);Pain Pain - Right/Left: Left Pain - part of body: Leg     Time: 1143-1200 PT Time Calculation (min) (ACUTE ONLY): 17 min  Charges:  $Gait Training: 8-22 mins                     Elizabeth Palau, PT, DPT, GCS [phone]    Geisha Abernathy 10/30/2022, 1:04 PM

## 2022-10-31 ENCOUNTER — Inpatient Hospital Stay: Payer: Medicare Other

## 2022-10-31 DIAGNOSIS — L03116 Cellulitis of left lower limb: Secondary | ICD-10-CM | POA: Diagnosis not present

## 2022-10-31 LAB — BASIC METABOLIC PANEL
Anion gap: 5 (ref 5–15)
BUN: 9 mg/dL (ref 6–20)
CO2: 30 mmol/L (ref 22–32)
Calcium: 8.5 mg/dL — ABNORMAL LOW (ref 8.9–10.3)
Chloride: 105 mmol/L (ref 98–111)
Creatinine, Ser: 0.67 mg/dL (ref 0.44–1.00)
GFR, Estimated: >60 mL/min (ref >60)
Glucose, Bld: 113 mg/dL — ABNORMAL HIGH (ref 70–99)
Potassium: 4.2 mmol/L (ref 3.5–5.1)
Sodium: 140 mmol/L (ref 135–145)

## 2022-10-31 LAB — GLUCOSE, CAPILLARY
Glucose-Capillary: 124 mg/dL — ABNORMAL HIGH (ref 70–99)
Glucose-Capillary: 97 mg/dL (ref 70–99)
Glucose-Capillary: 136 mg/dL — ABNORMAL HIGH (ref 70–99)
Glucose-Capillary: 112 mg/dL — ABNORMAL HIGH (ref 70–99)

## 2022-10-31 LAB — CBC
HCT: 31.4 % — ABNORMAL LOW (ref 36.0–46.0)
Hemoglobin: 10.2 g/dL — ABNORMAL LOW (ref 12.0–15.0)
MCH: 30.9 pg (ref 26.0–34.0)
MCHC: 32.5 g/dL (ref 30.0–36.0)
MCV: 95.2 fL (ref 80.0–100.0)
Platelets: 341 10*3/uL (ref 150–400)
RBC: 3.3 MIL/uL — ABNORMAL LOW (ref 3.87–5.11)
RDW: 14.9 % (ref 11.5–15.5)
WBC: 11.8 10*3/uL — ABNORMAL HIGH (ref 4.0–10.5)
nRBC: 0 % (ref 0.0–0.2)

## 2022-10-31 LAB — MAGNESIUM: Magnesium: 2.2 mg/dL (ref 1.7–2.4)

## 2022-10-31 LAB — PHOSPHORUS: Phosphorus: 4.7 mg/dL — ABNORMAL HIGH (ref 2.5–4.6)

## 2022-10-31 MED ORDER — FUROSEMIDE 10 MG/ML IJ SOLN
40.0000 mg | Freq: Once | INTRAMUSCULAR | Status: AC
  Administered 2022-10-31: 40 mg via INTRAVENOUS
  Filled 2022-10-31: qty 4

## 2022-10-31 NOTE — Progress Notes (Signed)
[facility] Progress Note  Patient: Caitlin Hardin    XAJ:[id number]  DOA: 10/22/2022     Date of Service: the patient was seen and examined on 10/31/2022  Chief Complaint  Patient presents with   Fever    Patient presents with LEFT lower leg redness, pain, and hot to touch (appears to be cellulitic); Patient did have a mechanical fall this morning around 05:00 when she was walking to the restroom; Ever since that fall, she had progressively worsening LEFT lower leg pain; Patient is febrile with temp of 103.1 (Tylenol 1,000 mg given); Sepsis work-up initiated   Fall   Brief hospital course: Caitlin Hardin is a 57 y.o. female with medical history significant for Class III obesity, BMI over 50, OSA on CPAP, HTN, DM, prior stroke, depression, COPD on home O2 at 2 L, CKD llla who presents to the ED after awakening with left lower extremity pain redness and swelling, which was noticed after sustaining a mechanical fall after waking at 5 AM and going to the bathroom.  She later developed a fever on her way to the hospital.   ED w/up: Tmax 103.1 with pulse 112 and respirations 23, O2 sat 95% on room air and BP 115/60.  Labs notable for WBC 21,000 with lactic acid 1.5.  Respiratory viral panel negative.CMP notable for creatinine 1.25 which is about her baseline and minor electrolyte abnormalities.  Total bilirubin 1.5.  Urinalysis mostly unremarkable. EKG, personally viewed and interpreted showing sinus tachycardia at 110 with no acute ST-T wave changes. Chest x-ray showed no focal consolidations Left lower extremity Doppler without evidence of DVT Patient was treated with sepsis fluids, started on cefepime and metronidazole and vancomycin. She was also given fluconazole for candidal rash in skin folds.  Hospitalist consulted for admission.    Assessment and Plan: Cellulitis of left lower extremity Sepsis, criteria include fever, tachypnea, soft blood pressure, leukocytosis lactic  acidosis, Lower extremity Doppler negative for DVT S/p cefepime and Flagyl, DC'd on 4/4 and started ceftriaxone 2 g IV daily S/p vancomycin for 5 days. S/p IVF for Sepsis  WBC count 21.7---14.8 trending down Pro-Cal 0.76, ESR 106, CRP 35.7 elevated 4/4 s/p Toradol twice daily for 2 days, continue prn Trend WBC count 4/9 LLE is still edematous and swollen, tenderness and erythema improved.  We will repeat left lower extremity ultrasound to rule out DVT and Lasix 40 mg x 1 dose ordered Advised to keep left lower extremity elevated  UTI, urine culture growing staph stimulants, pansensitive. Continue ceftriaxone.   Candidal intertrigo, improved S/p fluconazole  and Skin protective barriers   Chronic obstructive pulmonary disease, unspecified (HCC) Chronic respiratory failure with hypoxia Not acutely exacerbated -Continue home inhalers - DuoNebs as needed     AKI, baseline cr 0.52--0.86 CKD stage 2 Cr 1.16 elevated, s/p IVF overnight, Cr 0.67 improved   Class 3 severe obesity with body mass index (BMI) of 50.0 to 59.9 in adult  Complicating factor to overall prognosis and care   Diabetes mellitus type 2, noninsulin dependent (HCC) Sliding scale insulin coverage   Depression Continue Zoloft and bupropion   Essential hypertension Holding antihypertensives due to soft blood pressures likely related to sepsis.  Resume as appropriate   OSA (obstructive sleep apnea) CPAP nightly   Iron deficiency anemia, iron level 20, transferrin saturation 7% at lower end,  Continue oral iron supplement  Vitamin D insufficiency, vitamin D level 25, started oral supplement.   Body mass index is 52.86 kg/m.  Interventions:       Diet: Heart healthy/diabetic diet DVT Prophylaxis: Subcutaneous Lovenox   Advance goals of care discussion: Full code  Family Communication: family was not present at bedside, at the time of interview.  The pt provided permission to discuss medical plan  with the family. Opportunity was given to ask question and all questions were answered satisfactorily.   Disposition:  Pt is from ALF, admitted with sepsis due to LLE cellulitis, still on IV Abx, which precludes a safe discharge. Discharge to ALF with [facility]/OT, when clinically stable.  May need few days to improve. 4/9 still left lower extremity debility edematous and swollen, erythema and tenderness is improving.  IV Lasix one-time dose ordered and repeating venous duplex to rule out DVT   Subjective: No significant events overnight, left lower calf pain is improving, patient was able to ambulate with assistance.  Still has edema and tenderness on palpation, but feels improvement.  Denies any chest pain or palpitation, no shortness of breath.   Physical Exam: General: NAD, lying comfortably Appear in no distress, affect appropriate Eyes: PERRLA ENT: Oral Mucosa Clear, moist  Neck: no JVD,  Cardiovascular: S1 and S2 Present, no Murmur,  Respiratory: good respiratory effort, Bilateral Air entry equal and Decreased, no Crackles, no wheezes Abdomen: Bowel Sound present, Soft and no tenderness,  Skin: no rashes Extremities: LLE erythema and tenderness, 2-3+ edema. no calf tenderness Neurologic: without any new focal findings Gait not checked due to patient safety concerns  Vitals:   10/30/22 1459 10/30/22 2318 10/31/22 0735 10/31/22 1236  BP: 133/73 (!) 158/57 136/62 (!) 144/42  Pulse: 79 66 78 86  Resp: 17 20 18    Temp: 98 F (36.7 C) 97.9 F (36.6 C) 98.1 F (36.7 C)   TempSrc:   Oral   SpO2: 100% 96% 91% 100%  Weight:      Height:        Intake/Output Summary (Last 24 hours) at 10/31/2022 1307 Last data filed at 10/31/2022 0305 Gross per 24 hour  Intake 501.68 ml  Output --  Net 501.68 ml    Filed Weights   10/22/22 1830 10/22/22 2212  Weight: 116.1 kg 118.7 kg    Data Reviewed: I have personally reviewed and interpreted daily labs, tele strips, imagings as discussed  above. I reviewed all nursing notes, pharmacy notes, vitals, pertinent old records I have discussed plan of care as described above with RN and patient/family.  CBC: Recent Labs  Lab 10/27/22 0414 10/28/22 0427 10/29/22 0447 10/30/22 0750 10/31/22 0530  WBC 15.7* 14.2* 14.7* 14.8* 11.8*  HGB 9.6* 9.7* 9.5* 10.2* 10.2*  HCT 30.6* 30.1* 29.7* 32.1* 31.4*  MCV 94.7 95.3 95.2 95.5 95.2  PLT 242 258 294 365 341   Basic Metabolic Panel: Recent Labs  Lab 10/25/22 0404 10/26/22 0442 10/27/22 0414 10/28/22 0427 10/29/22 0447 10/30/22 0418 10/31/22 0530  NA 136   < > 141 140 137 138 140  K 3.8   < > 3.6 3.7 3.9 4.6 4.2  CL 101   < > 106 102 102 111 105  CO2 23   < > 27 27 29 22 30   GLUCOSE 107*   < > 102* 111* 114* 59* 113*  BUN 8   < > 7 7 10  31* 9  CREATININE 0.73   < > 0.70 0.52 0.53 1.16* 0.67  CALCIUM 8.9   < > 8.5* 8.4* 8.2* 8.4* 8.5*  MG 2.1  --   --   --   --   --  2.2  PHOS 3.5  --   --   --   --   --  4.7*   < > = values in this interval not displayed.    Studies: No results found.  Scheduled Meds:  buPROPion  100 mg Oral BID   calcium carbonate  1 tablet Oral BID WC   docusate sodium  100 mg Oral BID   enoxaparin (LOVENOX) injection  0.5 mg/kg Subcutaneous Q24H   ferrous sulfate  325 mg Oral BID   furosemide  40 mg Intravenous Once   gabapentin  300 mg Oral BID   insulin aspart  0-20 Units Subcutaneous TID WC   insulin aspart  0-5 Units Subcutaneous QHS   latanoprost  1 drop Both Eyes QHS   liver oil-zinc oxide  1 Application Topical q morning   magnesium oxide  400 mg Oral BID   oxybutynin  5 mg Oral BID   pantoprazole  40 mg Oral Daily   polyethylene glycol  17 g Oral Daily   sertraline  25 mg Oral Daily   Vitamin D (Ergocalciferol)  50,000 Units Oral Q7 days   Continuous Infusions:  cefTRIAXone (ROCEPHIN)  IV 2 g (10/30/22 1442)   PRN Meds: acetaminophen **OR** acetaminophen, albuterol, bisacodyl, HYDROcodone-acetaminophen, morphine injection,  ondansetron **OR** ondansetron (ZOFRAN) IV  Time spent: 35 minutes  Author: Gillis SantaILEEP Rian Busche. MD [facility] 10/31/2022 1:07 PM  To reach On-call, see care teams to locate the attending and reach out to them via [email or web address]. If 7PM-7AM, please contact night-coverage If you still have difficulty reaching the attending provider, please [facility] for [facility] on amion for assistance.

## 2022-10-31 NOTE — Plan of Care (Signed)
  Problem: Education: Goal: Knowledge of General Education information will improve Description: Including pain rating scale, medication(s)/side effects and non-pharmacologic comfort measures Outcome: Progressing   Problem: Health Behavior/Discharge Planning: Goal: Ability to manage health-related needs will improve Outcome: Progressing   Problem: Clinical Measurements: Goal: Ability to maintain clinical measurements within normal limits will improve Outcome: Progressing   Problem: Activity: Goal: Risk for activity intolerance will decrease Outcome: Progressing   Problem: Nutrition: Goal: Adequate nutrition will be maintained Outcome: Progressing   Problem: Elimination: Goal: Will not experience complications related to bowel motility Outcome: Progressing Goal: Will not experience complications related to urinary retention Outcome: Progressing   Problem: Pain Managment: Goal: General experience of comfort will improve Outcome: Progressing   

## 2022-10-31 NOTE — Progress Notes (Signed)
Physical Therapy Treatment Patient Details Name: Caitlin Hardin MRN: [id number] DOB: [birth year] Today's Date: 10/31/2022   History of Present Illness Pt is a 57 y.o. female with medical history significant for Class III obesity, BMI over 50, OSA on CPAP, HTN, DM, prior stroke, depression, COPD on home O2 at 2 L, CKD llla who presents to the ED after awakening with left lower extremity pain redness and swelling, which was noticed after sustaining a mechanical fall after waking at 5 AM and going to the bathroom.  She later developed a fever on her way to the hospital.  MD assessment includes: cellulitis of left lower extremity, sepsis, candidal intertrigo, and iron deficiency anemia.    PT Comments    Pt is making good progress towards goals with ability to ambulate around RN station with RW. Safe technique with no rest breaks required this date. All mobility on 3L of O2. Pt with no reports of pain at this time in leg and prefers to sit in recliner at this time. Will continue to progress as able.   Recommendations for follow up therapy are one component of a multi-disciplinary discharge planning process, led by the attending physician.  Recommendations may be updated based on patient status, additional functional criteria and insurance authorization.  Follow Up Recommendations       Assistance Recommended at Discharge Intermittent Supervision/Assistance  Patient can return home with the following A little help with walking and/or transfers;A little help with bathing/dressing/bathroom;Assistance with cooking/housework;Direct supervision/assist for medications management;Assist for transportation   Equipment Recommendations       Recommendations for Other Services       Precautions / Restrictions Precautions Precautions: Fall Restrictions Weight Bearing Restrictions: No     Mobility  Bed Mobility               General bed mobility comments: NT patient up in recliner     Transfers Overall transfer level: Modified independent Equipment used: Rolling walker (2 wheels) Transfers: Sit to/from Stand Sit to Stand: Modified independent (Device/Increase time)           General transfer comment: Able to push from seated surface. All mobility performed with 3L of O2    Ambulation/Gait Ambulation/Gait assistance: Supervision Gait Distance (Feet): 220 Feet Assistive device: Rolling walker (2 wheels) Gait Pattern/deviations: Step-through pattern       General Gait Details: ambulated without need for chair follow this date. O2 sats on 3L with sats at 91% with exertion and HR at 127bpm. No rest breaks required   Stairs             Wheelchair Mobility    Modified Rankin (Stroke Patients Only)       Balance Overall balance assessment: Needs assistance Sitting-balance support: Feet supported Sitting balance-Leahy Scale: Good     Standing balance support: Bilateral upper extremity supported, During functional activity, Reliant on assistive device for balance Standing balance-Leahy Scale: Fair                              Cognition Arousal/Alertness: Awake/alert Behavior During Therapy: WFL for tasks assessed/performed Overall Cognitive Status: Within Functional Limits for tasks assessed                                 General Comments: pleasant and agreeable to session        Exercises  General Comments        Pertinent Vitals/Pain Pain Assessment Pain Assessment: No/denies pain    Home Living                          Prior Function            PT Goals (current goals can now be found in the care plan section) Acute Rehab PT Goals Patient Stated Goal: To get stronger PT Goal Formulation: With patient Time For Goal Achievement: 11/05/22 Potential to Achieve Goals: Good Progress towards PT goals: Progressing toward goals    Frequency    Min 2X/week      PT Plan Current  plan remains appropriate    Co-evaluation              AM-PAC PT "6 Clicks" Mobility   Outcome Measure  Help needed turning from your back to your side while in a flat bed without using bedrails?: A Little Help needed moving from lying on your back to sitting on the side of a flat bed without using bedrails?: A Little Help needed moving to and from a bed to a chair (including a wheelchair)?: A Little Help needed standing up from a chair using your arms (e.g., wheelchair or bedside chair)?: A Little Help needed to walk in hospital room?: A Little Help needed climbing 3-5 steps with a railing? : A Little 6 Click Score: 18    End of Session Equipment Utilized During Treatment: Oxygen Activity Tolerance: Patient tolerated treatment well Patient left: in chair;with chair alarm set Nurse Communication: Mobility status PT Visit Diagnosis: History of falling (Z91.81);Difficulty in walking, not elsewhere classified (R26.2);Muscle weakness (generalized) (M62.81);Pain Pain - Right/Left: Left Pain - part of body: Leg     Time: 0737-1062 PT Time Calculation (min) (ACUTE ONLY): 14 min  Charges:  $Gait Training: 8-22 mins                     Elizabeth Palau, PT, DPT, GCS [phone]    Terrianna Holsclaw 10/31/2022, 9:50 AM

## 2022-10-31 NOTE — Care Management Important Message (Signed)
Important Message  Patient Details  Name: Caitlin Hardin MRN: [id number] Date of Birth: 20-Mar-1966   Medicare Important Message Given:  Yes     Olegario Messier A Cambri Plourde 10/31/2022, 2:52 PM

## 2022-11-01 DIAGNOSIS — L03116 Cellulitis of left lower limb: Secondary | ICD-10-CM | POA: Diagnosis not present

## 2022-11-01 LAB — BASIC METABOLIC PANEL
Anion gap: 7 (ref 5–15)
BUN: 11 mg/dL (ref 6–20)
CO2: 32 mmol/L (ref 22–32)
Calcium: 9.2 mg/dL (ref 8.9–10.3)
Chloride: 99 mmol/L (ref 98–111)
Creatinine, Ser: 0.76 mg/dL (ref 0.44–1.00)
GFR, Estimated: >60 mL/min (ref >60)
Glucose, Bld: 188 mg/dL — ABNORMAL HIGH (ref 70–99)
Potassium: 4.6 mmol/L (ref 3.5–5.1)
Sodium: 138 mmol/L (ref 135–145)

## 2022-11-01 LAB — CBC
HCT: 33.8 % — ABNORMAL LOW (ref 36.0–46.0)
Hemoglobin: 10.6 g/dL — ABNORMAL LOW (ref 12.0–15.0)
MCH: 30.4 pg (ref 26.0–34.0)
MCHC: 31.4 g/dL (ref 30.0–36.0)
MCV: 96.8 fL (ref 80.0–100.0)
Platelets: 358 10*3/uL (ref 150–400)
RBC: 3.49 MIL/uL — ABNORMAL LOW (ref 3.87–5.11)
RDW: 15.2 % (ref 11.5–15.5)
WBC: 10.9 10*3/uL — ABNORMAL HIGH (ref 4.0–10.5)
nRBC: 0 % (ref 0.0–0.2)

## 2022-11-01 LAB — GLUCOSE, CAPILLARY
Glucose-Capillary: 135 mg/dL — ABNORMAL HIGH (ref 70–99)
Glucose-Capillary: 109 mg/dL — ABNORMAL HIGH (ref 70–99)
Glucose-Capillary: 106 mg/dL — ABNORMAL HIGH (ref 70–99)
Glucose-Capillary: 112 mg/dL — ABNORMAL HIGH (ref 70–99)

## 2022-11-01 MED ORDER — CEPHALEXIN 500 MG PO CAPS: 500.0000 mg | ORAL_CAPSULE | Freq: Three times a day (TID) | ORAL | Status: DC

## 2022-11-01 MED ORDER — FUROSEMIDE 10 MG/ML IJ SOLN
40.0000 mg | Freq: Once | INTRAMUSCULAR | Status: AC
  Administered 2022-11-01: 40 mg via INTRAVENOUS
  Filled 2022-11-01: qty 4

## 2022-11-01 NOTE — Progress Notes (Signed)
Physical Therapy Treatment Patient Details Name: Caitlin Hardin MRN: [id number] DOB: 1966-01-04 Today's Date: 11/01/2022   History of Present Illness Pt is a 57 y.o. female with medical history significant for Class III obesity, BMI over 50, OSA on CPAP, HTN, DM, prior stroke, depression, COPD on home O2 at 2 L, CKD llla who presents to the ED after awakening with left lower extremity pain redness and swelling, which was noticed after sustaining a mechanical fall after waking at 5 AM and going to the bathroom.  She later developed a fever on her way to the hospital.  MD assessment includes: cellulitis of left lower extremity, sepsis, candidal intertrigo, and iron deficiency anemia.    PT Comments    Pt is making good progress towards goals with ability to ambulate multiple laps in hallway using RW. All mobility performed on 3L of O2 with safe technique. Pt is hopeful for home discharge tomorrow. Will continue to progress as able.   Recommendations for follow up therapy are one component of a multi-disciplinary discharge planning process, led by the attending physician.  Recommendations may be updated based on patient status, additional functional criteria and insurance authorization.  Follow Up Recommendations       Assistance Recommended at Discharge Intermittent Supervision/Assistance  Patient can return home with the following A little help with walking and/or transfers;A little help with bathing/dressing/bathroom;Assistance with cooking/housework;Direct supervision/assist for medications management;Assist for transportation   Equipment Recommendations  Rolling walker (2 wheels)    Recommendations for Other Services       Precautions / Restrictions Precautions Precautions: Fall Restrictions Weight Bearing Restrictions: No     Mobility  Bed Mobility Overal bed mobility: Independent             General bed mobility comments: safe technique. Once seated at EOB, upright  posture noted. Still requires assist for donning shoes, however is able to don socks with supervision    Transfers Overall transfer level: Modified independent Equipment used: Rolling walker (2 wheels) Transfers: Sit to/from Stand, Bed to chair/wheelchair/BSC Sit to Stand: Modified independent (Device/Increase time)           General transfer comment: safe technique with upright posture once standing    Ambulation/Gait Ambulation/Gait assistance: Supervision Gait Distance (Feet): 400 Feet Assistive device: Rolling walker (2 wheels) Gait Pattern/deviations: Step-through pattern       General Gait Details: ambulated around RN station x 2 laps this PM. Reciprocal gait pattern performed with improved balance and decreased unsteadiness. All mobility performed on 3L of O2 with sats at 94% with exertion   Stairs             Wheelchair Mobility    Modified Rankin (Stroke Patients Only)       Balance Overall balance assessment: Needs assistance Sitting-balance support: Feet supported Sitting balance-Leahy Scale: Good     Standing balance support: Single extremity supported, No upper extremity supported, During functional activity Standing balance-Leahy Scale: Fair                              Cognition Arousal/Alertness: Awake/alert Behavior During Therapy: WFL for tasks assessed/performed Overall Cognitive Status: Within Functional Limits for tasks assessed                                 General Comments: pleasant and agreeable to session  Exercises Other Exercises Other Exercises: ambulated fo BSC with ability to void and perform hygiene with safe technique.    General Comments        Pertinent Vitals/Pain Pain Assessment Pain Assessment: No/denies pain    Home Living                          Prior Function            PT Goals (current goals can now be found in the care plan section) Acute Rehab PT  Goals Patient Stated Goal: To get stronger PT Goal Formulation: With patient Time For Goal Achievement: 11/05/22 Potential to Achieve Goals: Good Progress towards PT goals: Progressing toward goals    Frequency    Min 2X/week      PT Plan Current plan remains appropriate    Co-evaluation              AM-PAC PT "6 Clicks" Mobility   Outcome Measure  Help needed turning from your back to your side while in a flat bed without using bedrails?: A Little Help needed moving from lying on your back to sitting on the side of a flat bed without using bedrails?: A Little Help needed moving to and from a bed to a chair (including a wheelchair)?: A Little Help needed standing up from a chair using your arms (e.g., wheelchair or bedside chair)?: A Little Help needed to walk in hospital room?: A Little Help needed climbing 3-5 steps with a railing? : A Little 6 Click Score: 18    End of Session Equipment Utilized During Treatment: Oxygen Activity Tolerance: Patient tolerated treatment well Patient left: in chair;with chair alarm set Nurse Communication: Mobility status PT Visit Diagnosis: History of falling (Z91.81);Difficulty in walking, not elsewhere classified (R26.2);Muscle weakness (generalized) (M62.81);Pain Pain - Right/Left: Left Pain - part of body: Leg     Time: 1414-1430 PT Time Calculation (min) (ACUTE ONLY): 16 min  Charges:  $Gait Training: 8-22 mins                     Elizabeth Palau, PT, DPT, GCS [phone]    Ordean Fouts 11/01/2022, 2:43 PM

## 2022-11-01 NOTE — Plan of Care (Signed)
  Problem: Education: Goal: Knowledge of General Education information will improve Description: Including pain rating scale, medication(s)/side effects and non-pharmacologic comfort measures Outcome: Progressing   Problem: Health Behavior/Discharge Planning: Goal: Ability to manage health-related needs will improve Outcome: Progressing   Problem: Clinical Measurements: Goal: Ability to maintain clinical measurements within normal limits will improve Outcome: Progressing Goal: Diagnostic test results will improve Outcome: Progressing Goal: Cardiovascular complication will be avoided Outcome: Progressing   Problem: Activity: Goal: Risk for activity intolerance will decrease Outcome: Progressing   Problem: Nutrition: Goal: Adequate nutrition will be maintained Outcome: Progressing   Problem: Elimination: Goal: Will not experience complications related to bowel motility Outcome: Progressing Goal: Will not experience complications related to urinary retention Outcome: Progressing   Problem: Pain Managment: Goal: General experience of comfort will improve Outcome: Progressing   Problem: Skin Integrity: Goal: Risk for impaired skin integrity will decrease Outcome: Progressing   

## 2022-11-01 NOTE — Progress Notes (Signed)
Occupational Therapy Treatment Patient Details Name: Caitlin Hardin MRN: [id number] DOB: 06/23/1966 Today's Date: 11/01/2022   History of present illness Pt is a 57 y.o. female with medical history significant for Class III obesity, BMI over 50, OSA on CPAP, HTN, DM, prior stroke, depression, COPD on home O2 at 2 L, CKD llla who presents to the ED after awakening with left lower extremity pain redness and swelling, which was noticed after sustaining a mechanical fall after waking at 5 AM and going to the bathroom.  She later developed a fever on her way to the hospital.  MD assessment includes: cellulitis of left lower extremity, sepsis, candidal intertrigo, and iron deficiency anemia.   OT comments  Patient received sitting in recliner and agreeable to OT. Pt completed BSC transfer with supervision and no AD. Pt with continent void on [facility] and required Min A for thoroughness of posterior hygiene. Pt was educated on energy conservation techniques with handout provided explaining the 4 P's (plan, prioritize, pace, position) as well as strategies for how to conserve energy for specific ADL tasks. Will continue to address during hospital stay as pt would benefit from further opportunities to practice implementing energy conservation techniques during self-care tasks. Pt left as received with all needs in reach. Pt is making progress toward goal completion. D/C recommendation remains appropriate. OT will continue to follow acutely.    Recommendations for follow up therapy are one component of a multi-disciplinary discharge planning process, led by the attending physician.  Recommendations may be updated based on patient status, additional functional criteria and insurance authorization.    Assistance Recommended at Discharge Intermittent Supervision/Assistance  Patient can return home with the following  A little help with bathing/dressing/bathroom;Assistance with cooking/housework;Assist for  transportation;Help with stairs or ramp for entrance;Direct supervision/assist for medications management;A little help with walking and/or transfers   Equipment Recommendations  Other (comment) (reacher)    Recommendations for Other Services      Precautions / Restrictions Precautions Precautions: Fall Restrictions Weight Bearing Restrictions: No       Mobility Bed Mobility               General bed mobility comments: NT, pt received/left in recliner    Transfers Overall transfer level: Modified independent Equipment used: Rolling walker (2 wheels) Transfers: Sit to/from Stand, Bed to chair/wheelchair/BSC Sit to Stand: Modified independent (Device/Increase time)     Step pivot transfers: Supervision           Balance Overall balance assessment: Needs assistance Sitting-balance support: Feet supported Sitting balance-Leahy Scale: Good     Standing balance support: Single extremity supported, No upper extremity supported, During functional activity Standing balance-Leahy Scale: Fair         ADL either performed or assessed with clinical judgement   ADL Overall ADL's : Needs assistance/impaired       Toilet Transfer: BSC/3in1;Supervision/safety;Ambulation Toilet Transfer Details (indicate cue type and reason): short ambulatory transfer, pt requesting to use BSC vs ambulating to bathroom Toileting- Clothing Manipulation and Hygiene: Minimal assistance;Sit to/from stand Toileting - Clothing Manipulation Details (indicate cue type and reason): Min A for thoroughness of posterior hygiene after continent void on [facility]            Extremity/Trunk Assessment Upper Extremity Assessment Upper Extremity Assessment: Generalized weakness   Lower Extremity Assessment Lower Extremity Assessment: Generalized weakness        Vision Baseline Vision/History: 1 Wears glasses (readers only) Patient Visual Report: No change from baseline  Perception      Praxis      Cognition Arousal/Alertness: Awake/alert Behavior During Therapy: WFL for tasks assessed/performed Overall Cognitive Status: Within Functional Limits for tasks assessed          Exercises Other Exercises Other Exercises: Education provided re: energy conservation techniques with handout provided    Shoulder Instructions       General Comments      Pertinent Vitals/ Pain       Pain Assessment Pain Assessment: 0-10 Pain Score: 2  Pain Location: LLE Pain Descriptors / Indicators: Aching, Sore Pain Intervention(s): Limited activity within patient's tolerance, Monitored during session, Premedicated before session  Home Living          Prior Functioning/Environment              Frequency  Min 1X/week        Progress Toward Goals  OT Goals(current goals can now be found in the care plan section)  Progress towards OT goals: Progressing toward goals  Acute Rehab OT Goals Patient Stated Goal: return to ALF OT Goal Formulation: With patient Time For Goal Achievement: 11/06/22 Potential to Achieve Goals: Good   Plan Discharge plan remains appropriate;Frequency remains appropriate    Co-evaluation                 AM-PAC OT "6 Clicks" Daily Activity     Outcome Measure   Help from another person eating meals?: None Help from another person taking care of personal grooming?: A Little Help from another person toileting, which includes using toliet, bedpan, or urinal?: A Little Help from another person bathing (including washing, rinsing, drying)?: A Little Help from another person to put on and taking off regular upper body clothing?: None Help from another person to put on and taking off regular lower body clothing?: A Lot 6 Click Score: 19    End of Session    OT Visit Diagnosis: Unsteadiness on feet (R26.81);Muscle weakness (generalized) (M62.81);History of falling (Z91.81);Pain   Activity Tolerance Patient tolerated treatment  well   Patient Left in chair;with call bell/phone within reach;with chair alarm set   Nurse Communication Mobility status        Time: 1030-1043 OT Time Calculation (min): 13 min  Charges: OT General Charges $OT Visit: 1 Visit OT Treatments $Self Care/Home Management : 8-22 mins  [facility] MS, OTR/L ascom [phone]  11/01/22, 11:24 AM

## 2022-11-01 NOTE — Progress Notes (Signed)
[facility] Progress Note  Patient: Caitlin Hardin    WUJ:[id number]  DOA: 10/22/2022     Date of Service: the patient was seen and examined on 11/01/2022  Chief Complaint  Patient presents with   Fever    Patient presents with LEFT lower leg redness, pain, and hot to touch (appears to be cellulitic); Patient did have a mechanical fall this morning around 05:00 when she was walking to the restroom; Ever since that fall, she had progressively worsening LEFT lower leg pain; Patient is febrile with temp of 103.1 (Tylenol 1,000 mg given); Sepsis work-up initiated   Fall   Brief hospital course: Caitlin Hardin is a 57 y.o. female with medical history significant for Class III obesity, BMI over 50, OSA on CPAP, HTN, DM, prior stroke, depression, COPD on home O2 at 2 L, CKD llla who presents to the ED after awakening with left lower extremity pain redness and swelling, which was noticed after sustaining a mechanical fall after waking at 5 AM and going to the bathroom.  She later developed a fever on her way to the hospital.   ED w/up: Tmax 103.1 with pulse 112 and respirations 23, O2 sat 95% on room air and BP 115/60.  Labs notable for WBC 21,000 with lactic acid 1.5.  Respiratory viral panel negative.CMP notable for creatinine 1.25 which is about her baseline and minor electrolyte abnormalities.  Total bilirubin 1.5.  Urinalysis mostly unremarkable. EKG, personally viewed and interpreted showing sinus tachycardia at 110 with no acute ST-T wave changes. Chest x-ray showed no focal consolidations Left lower extremity Doppler without evidence of DVT Patient was treated with sepsis fluids, started on cefepime and metronidazole and vancomycin. She was also given fluconazole for candidal rash in skin folds.  Hospitalist consulted for admission.    Assessment and Plan: Cellulitis of left lower extremity Sepsis, criteria include fever, tachypnea, soft blood pressure, leukocytosis lactic  acidosis, Lower extremity Doppler negative for DVT S/p cefepime and Flagyl, DC'd on 4/4 and started ceftriaxone 2 g IV daily S/p vancomycin for 5 days. S/p IVF for Sepsis  WBC count 21.7---14.8 trending down Pro-Cal 0.76, ESR 106, CRP 35.7 elevated 4/4 s/p Toradol twice daily for 2 days, continue prn Trend WBC count 4/9 repeat venous duplex negative for DVT, s/p Lasix 40 mg x 1 dose given, edema improved 4/10 Lasix 40 mg x 1 dose ordered Advised to keep left lower extremity elevated  UTI, urine culture growing staph stimulants, pansensitive. Continue ceftriaxone.   Candidal intertrigo, improved S/p fluconazole  and Skin protective barriers   Chronic obstructive pulmonary disease, unspecified (HCC) Chronic respiratory failure with hypoxia Not acutely exacerbated -Continue home inhalers - DuoNebs as needed     AKI, baseline cr 0.52--0.86 CKD stage 2 Cr 1.16 elevated, s/p IVF overnight, Cr 0.67 improved   Class 3 severe obesity with body mass index (BMI) of 50.0 to 59.9 in adult  Complicating factor to overall prognosis and care   Diabetes mellitus type 2, noninsulin dependent (HCC) Sliding scale insulin coverage   Depression Continue Zoloft and bupropion   Essential hypertension Holding antihypertensives due to soft blood pressures likely related to sepsis.  Resume as appropriate   OSA (obstructive sleep apnea) CPAP nightly   Iron deficiency anemia, iron level 20, transferrin saturation 7% at lower end,  Continue oral iron supplement  Vitamin D insufficiency, vitamin D level 25, started oral supplement.   Body mass index is 52.86 kg/m.  Interventions:  Diet: Heart healthy/diabetic diet DVT Prophylaxis: Subcutaneous Lovenox   Advance goals of care discussion: Full code  Family Communication: family was not present at bedside, at the time of interview.  The pt provided permission to discuss medical plan with the family. Opportunity was given to ask  question and all questions were answered satisfactorily.   Disposition:  Pt is from ALF, admitted with sepsis due to LLE cellulitis, still on IV Abx, which precludes a safe discharge. Discharge to ALF with [facility] PT/OT, when clinically stable.  May need few days to improve. 4/9 still left lower extremity debility edematous and swollen, erythema and tenderness is improving.  IV Lasix one-time dose ordered and repeating venous duplex to rule out DVT   Subjective: No significant events overnight, left lower calf pain is improving, patient was able to ambulate with assistance.  Still has edema and tenderness on palpation, but feels improvement.  Denies any chest pain or palpitation, no shortness of breath.   Physical Exam: General: NAD, lying comfortably Appear in no distress, affect appropriate Eyes: PERRLA ENT: Oral Mucosa Clear, moist  Neck: no JVD,  Cardiovascular: S1 and S2 Present, no Murmur,  Respiratory: good respiratory effort, Bilateral Air entry equal and Decreased, no Crackles, no wheezes Abdomen: Bowel Sound present, Soft and no tenderness,  Skin: no rashes Extremities: LLE erythema and tenderness, 2+ edema. no calf tenderness Neurologic: without any new focal findings Gait not checked due to patient safety concerns  Vitals:   10/31/22 1236 10/31/22 1528 10/31/22 2328 11/01/22 0752  BP: (!) 144/42 139/60 (!) 142/56 (!) 154/80  Pulse: 86 78 72 78  Resp:  20 20 17   Temp:  97.9 F (36.6 C) 98 F (36.7 C) 97.7 F (36.5 C)  TempSrc:      SpO2: 100% 99% 94% 99%  Weight:      Height:        Intake/Output Summary (Last 24 hours) at 11/01/2022 1400 Last data filed at 11/01/2022 1051 Gross per 24 hour  Intake 240 ml  Output 0 ml  Net 240 ml    Filed Weights   10/22/22 1830 10/22/22 2212  Weight: 116.1 kg 118.7 kg    Data Reviewed: I have personally reviewed and interpreted daily labs, tele strips, imagings as discussed above. I reviewed all nursing notes, pharmacy  notes, vitals, pertinent old records I have discussed plan of care as described above with RN and patient/family.  CBC: Recent Labs  Lab 10/28/22 0427 10/29/22 0447 10/30/22 0750 10/31/22 0530 11/01/22 0857  WBC 14.2* 14.7* 14.8* 11.8* 10.9*  HGB 9.7* 9.5* 10.2* 10.2* 10.6*  HCT 30.1* 29.7* 32.1* 31.4* 33.8*  MCV 95.3 95.2 95.5 95.2 96.8  PLT 258 294 365 341 358   Basic Metabolic Panel: Recent Labs  Lab 10/28/22 0427 10/29/22 0447 10/30/22 0418 10/31/22 0530 11/01/22 0857  NA 140 137 138 140 138  K 3.7 3.9 4.6 4.2 4.6  CL 102 102 111 105 99  CO2 27 29 22 30  32  GLUCOSE 111* 114* 59* 113* 188*  BUN 7 10 31* 9 11  CREATININE 0.52 0.53 1.16* 0.67 0.76  CALCIUM 8.4* 8.2* 8.4* 8.5* 9.2  MG  --   --   --  2.2  --   PHOS  --   --   --  4.7*  --     Studies: US Venous Img Lower Unilateral Left (DVT)  Result Date: 10/31/2022 CLINICAL DATA:  Left lower extremity pain and edema. Evaluate for DVT. EXAM:  LEFT LOWER EXTREMITY VENOUS DOPPLER ULTRASOUND TECHNIQUE: Gray-scale sonography with graded compression, as well as color Doppler and duplex ultrasound were performed to evaluate the lower extremity deep venous systems from the level of the common femoral vein and including the common femoral, femoral, profunda femoral, popliteal and calf veins including the posterior tibial, peroneal and gastrocnemius veins when visible. The superficial great saphenous vein was also interrogated. Spectral Doppler was utilized to evaluate flow at rest and with distal augmentation maneuvers in the common femoral, femoral and popliteal veins. COMPARISON:  Left lower extremity venous Doppler ultrasound-10/22/2022 (negative) FINDINGS: Contralateral Common Femoral Vein: Respiratory phasicity is normal and symmetric with the symptomatic side. No evidence of thrombus. Normal compressibility. Common Femoral Vein: No evidence of thrombus. Normal compressibility, respiratory phasicity and response to augmentation.  Saphenofemoral Junction: No evidence of thrombus. Normal compressibility and flow on color Doppler imaging. Profunda Femoral Vein: No evidence of thrombus. Normal compressibility and flow on color Doppler imaging. Femoral Vein: No evidence of thrombus. Normal compressibility, respiratory phasicity and response to augmentation. Popliteal Vein: No evidence of thrombus. Normal compressibility, respiratory phasicity and response to augmentation. Calf Veins: No evidence of thrombus. Normal compressibility and flow on color Doppler imaging. Superficial Great Saphenous Vein: No evidence of thrombus. Normal compressibility. Other Findings:  None. IMPRESSION: No evidence of DVT within the left lower extremity. Electronically Signed   By: Simonne Come M.D.   On: 10/31/2022 16:20    Scheduled Meds:  buPROPion  100 mg Oral BID   calcium carbonate  1 tablet Oral BID WC   docusate sodium  100 mg Oral BID   enoxaparin (LOVENOX) injection  0.5 mg/kg Subcutaneous Q24H   ferrous sulfate  325 mg Oral BID   gabapentin  300 mg Oral BID   insulin aspart  0-20 Units Subcutaneous TID WC   insulin aspart  0-5 Units Subcutaneous QHS   latanoprost  1 drop Both Eyes QHS   liver oil-zinc oxide  1 Application Topical q morning   magnesium oxide  400 mg Oral BID   oxybutynin  5 mg Oral BID   pantoprazole  40 mg Oral Daily   polyethylene glycol  17 g Oral Daily   sertraline  25 mg Oral Daily   Vitamin D (Ergocalciferol)  50,000 Units Oral Q7 days   Continuous Infusions:  cefTRIAXone (ROCEPHIN)  IV 2 g (11/01/22 1310)   PRN Meds: acetaminophen **OR** acetaminophen, albuterol, bisacodyl, HYDROcodone-acetaminophen, morphine injection, ondansetron **OR** ondansetron (ZOFRAN) IV  Time spent: 35 minutes  Author: Gillis Santa. MD [facility] 11/01/2022 2:00 PM  To reach On-call, see care teams to locate the attending and reach out to them via [email or web address]. If 7PM-7AM, please contact night-coverage If you still have  difficulty reaching the attending provider, please page the [facility] (Director on Call) for [facility] on amion for assistance.

## 2022-11-02 DIAGNOSIS — L03116 Cellulitis of left lower limb: Secondary | ICD-10-CM | POA: Diagnosis not present

## 2022-11-02 LAB — CBC
HCT: 33.3 % — ABNORMAL LOW (ref 36.0–46.0)
Hemoglobin: 10.6 g/dL — ABNORMAL LOW (ref 12.0–15.0)
MCH: 30.5 pg (ref 26.0–34.0)
MCHC: 31.8 g/dL (ref 30.0–36.0)
MCV: 96 fL (ref 80.0–100.0)
Platelets: 364 10*3/uL (ref 150–400)
RBC: 3.47 MIL/uL — ABNORMAL LOW (ref 3.87–5.11)
RDW: 15.1 % (ref 11.5–15.5)
WBC: 11.8 10*3/uL — ABNORMAL HIGH (ref 4.0–10.5)
nRBC: 0 % (ref 0.0–0.2)

## 2022-11-02 LAB — MAGNESIUM: Magnesium: 2.2 mg/dL (ref 1.7–2.4)

## 2022-11-02 LAB — BASIC METABOLIC PANEL
Anion gap: 8 (ref 5–15)
BUN: 15 mg/dL (ref 6–20)
CO2: 30 mmol/L (ref 22–32)
Calcium: 9.1 mg/dL (ref 8.9–10.3)
Chloride: 99 mmol/L (ref 98–111)
Creatinine, Ser: 0.75 mg/dL (ref 0.44–1.00)
GFR, Estimated: >60 mL/min (ref >60)
Glucose, Bld: 119 mg/dL — ABNORMAL HIGH (ref 70–99)
Potassium: 4.4 mmol/L (ref 3.5–5.1)
Sodium: 137 mmol/L (ref 135–145)

## 2022-11-02 LAB — GLUCOSE, CAPILLARY
Glucose-Capillary: 187 mg/dL — ABNORMAL HIGH (ref 70–99)
Glucose-Capillary: 117 mg/dL — ABNORMAL HIGH (ref 70–99)

## 2022-11-02 LAB — PHOSPHORUS: Phosphorus: 4.7 mg/dL — ABNORMAL HIGH (ref 2.5–4.6)

## 2022-11-02 MED ORDER — HYDROCERIN EX CREA
TOPICAL_CREAM | Freq: Two times a day (BID) | CUTANEOUS | Status: DC
  Filled 2022-11-02: qty 113

## 2022-11-02 MED ORDER — CEPHALEXIN 500 MG PO CAPS: 500.0000 mg | ORAL_CAPSULE | Freq: Three times a day (TID) | ORAL | 0 refills | Status: AC

## 2022-11-02 NOTE — Plan of Care (Signed)

## 2022-11-02 NOTE — TOC Transition Note (Signed)
Transition of Care [facility]) - CM/SW Discharge Note   Patient Details  Name: Caitlin Hardin MRN: [id number] Date of Birth: [birth year]  [facility]) CM/SW Contact:  Garret Reddish, RN Phone Number: 11/02/2022, 2:10 PM   Clinical Narrative:    Chart reviewed.  Noted that patient has orders for discharge today.  I have spoken with Tammy, Resident Coordinator at [facility] and informed her that patient will be a discharge for today. I have faxed Tammy patient's Discharge Summary and FL2.  Tammy reports that facility transport Zenaida Niece can transport patient to the facility today at 2:30pm.  Tammy has requested that patient be at [facility] at 2:30pm.  I have informed Tammy that [facility] will provide patient PT and OT at the facility.  I have made Barbara Cower, with [facility] aware that patient will be discharging to facility today.  [facility] has delivered a rolling walker at bedside.    I have informed staff nurse to call report to Tammy at [phone].  I have also made staff aware that patient will need to be at the [facility] entrance at 2:30 pm for transport to the facility.    No other TOC needs identified.      Final next level of care: Assisted Living (Assisted Living with Home Health services) Barriers to Discharge: No Barriers Identified   Patient Goals and CMS Choice      Discharge Placement                  Patient to be transferred to facility by: [facility]  Facility Transport Name of family member notified: Alesia Richards [phone] Patient and family notified of of transfer: 11/02/22  Discharge Plan and Services Additional resources added to the After Visit Summary for     Discharge Planning Services: CM Consult            DME Arranged: Dan Humphreys rolling DME Agency: [facility]       HH Arranged: PT, OT HH Agency: [facility] ([facility])     Representative spoke with at [facility] Agency: Barbara Cower  Social Determinants of Health (SDOH)  Interventions SDOH Screenings   Food Insecurity: No Food Insecurity (10/22/2022)  Housing: Low Risk  (10/22/2022)  Transportation Needs: No Transportation Needs (10/22/2022)  Utilities: Not At Risk (10/22/2022)  Tobacco Use: Low Risk  (10/28/2022)     Readmission Risk Interventions    03/27/2021   12:52 PM  Readmission Risk Prevention Plan  Post Dischage Appt Complete  Medication Screening Complete  Transportation Screening Complete

## 2022-11-02 NOTE — NC FL2 (Signed)
[place] MEDICAID FL2 LEVEL OF CARE FORM     IDENTIFICATION  Patient Name: Caitlin Hardin Birthdate: 07-Sep-1965 Sex: female Admission Date (Current Location): 10/22/2022  [place] and [place] Number:  [address] [phone] Facility and Address:  [facility], [address], [place], [place] [address]      Provider Number: [id number]  Attending Physician Name and Address:  Gillis Santa, MD  Relative Name and Phone Number:  Nellie Pinnix    Current Level of Care: Hospital Recommended Level of Care: Assisted Living Facility Prior Approval Number:    Date Approved/Denied:   PASRR Number:    Discharge Plan: Other (Comment) (Assisted Living)    Current Diagnoses: Patient Active Problem List   Diagnosis Date Noted   Cellulitis of left lower extremity 10/22/2022   Sepsis 10/22/2022   Candidal intertrigo 10/22/2022   Class 3 severe obesity with body mass index (BMI) of 50.0 to 59.9 in adult 10/22/2022   Stage 3a chronic kidney disease 10/22/2022   Chronic obstructive pulmonary disease, unspecified 10/22/2022   Chronic respiratory failure with hypoxia 10/22/2022   AKI (acute kidney injury) 03/25/2021   Acute lower UTI 03/25/2021   OSA (obstructive sleep apnea) 03/25/2021   Essential hypertension 03/25/2021   Depression 03/25/2021   Diabetes mellitus type 2, noninsulin dependent 03/25/2021   Obesity, diabetes, and hypertension syndrome 03/25/2021   Neutrophilic leukocytosis 03/25/2021   Asthma without status asthmaticus 11/09/2013   Sleep apnea, obstructive 11/09/2013    Orientation RESPIRATION BLADDER Height & Weight     Self, Time, Situation, Place  Normal Continent Weight: 118.7 kg Height:  4\' 11"  (149.9 cm)  BEHAVIORAL SYMPTOMS/MOOD NEUROLOGICAL BOWEL NUTRITION STATUS      Continent  (See discharge summary)  AMBULATORY STATUS COMMUNICATION OF NEEDS Skin   Limited Assist Verbally Normal                       Personal Care  Assistance Level of Assistance  Bathing, Dressing Bathing Assistance: Limited assistance Feeding assistance: Independent Dressing Assistance: Limited assistance     Functional Limitations Info  Sight, Hearing, Speech Sight Info: Adequate Hearing Info: Adequate Speech Info: Adequate    SPECIAL CARE FACTORS FREQUENCY  PT (By licensed PT), OT (By licensed OT)     PT Frequency: 3x weekly OT Frequency: 3x weekly            Contractures Contractures Info: Not present    Additional Factors Info  Code Status, Allergies Code Status Info: Full code Allergies Info: No Known Allergies           Current Medications (11/02/2022):  This is the current hospital active medication list Current Facility-Administered Medications  Medication Dose Route Frequency Provider Last Rate Last Admin   acetaminophen (TYLENOL) tablet 650 mg  650 mg Oral Q6H PRN Andris Baumann, MD   650 mg at 10/31/22 2112   Or   acetaminophen (TYLENOL) suppository 650 mg  650 mg Rectal Q6H PRN Andris Baumann, MD       albuterol (PROVENTIL) (2.5 MG/3ML) 0.083% nebulizer solution 2.5 mg  2.5 mg Nebulization Q2H PRN Andris Baumann, MD       bisacodyl (DULCOLAX) suppository 10 mg  10 mg Rectal QHS PRN Gillis Santa, MD       buPROPion [facility]) tablet 100 mg  100 mg Oral BID Lindajo Royal V, MD   100 mg at 11/02/22 0900   calcium carbonate (OS-CAL - dosed in mg of elemental calcium)  tablet 1,250 mg  1 tablet Oral BID WC Lindajo Royaluncan, Hazel V, MD   1,250 mg at 11/02/22 0900   cephALEXin (KEFLEX) capsule 500 mg  500 mg Oral TID WC & HS Gillis SantaKumar, Dileep, MD       docusate sodium (COLACE) capsule 100 mg  100 mg Oral BID Lindajo Royaluncan, Hazel V, MD   100 mg at 11/02/22 0859   enoxaparin (LOVENOX) injection 57.5 mg  0.5 mg/kg Subcutaneous Q24H Lindajo Royaluncan, Hazel V, MD   57.5 mg at 11/01/22 2224   ferrous sulfate tablet 325 mg  325 mg Oral BID Andris Baumannuncan, Hazel V, MD   325 mg at 11/02/22 0859   gabapentin (NEURONTIN) capsule 300 mg  300 mg Oral  BID Andris Baumannuncan, Hazel V, MD   300 mg at 11/02/22 16100859   hydrocerin (EUCERIN) cream   Topical BID Gillis SantaKumar, Dileep, MD   Given at 11/02/22 1133   HYDROcodone-acetaminophen (NORCO/VICODIN) 5-325 MG per tablet 1-2 tablet  1-2 tablet Oral Q4H PRN Andris Baumannuncan, Hazel V, MD   2 tablet at 10/30/22 2154   insulin aspart (novoLOG) injection 0-20 Units  0-20 Units Subcutaneous TID WC Andris Baumannuncan, Hazel V, MD   4 Units at 11/02/22 0901   insulin aspart (novoLOG) injection 0-5 Units  0-5 Units Subcutaneous QHS Andris Baumannuncan, Hazel V, MD       latanoprost (XALATAN) 0.005 % ophthalmic solution 1 drop  1 drop Both Eyes QHS Lindajo Royaluncan, Hazel V, MD   1 drop at 11/01/22 2225   liver oil-zinc oxide (DESITIN) 40 % ointment 1 Application  1 Application Topical q morning Andris Baumannuncan, Hazel V, MD   1 Application at 11/01/22 0947   magnesium oxide (MAG-OX) tablet 400 mg  400 mg Oral BID Andris Baumannuncan, Hazel V, MD   400 mg at 11/02/22 0859   morphine (PF) 2 MG/ML injection 2 mg  2 mg Intravenous Q2H PRN Andris Baumannuncan, Hazel V, MD   2 mg at 10/27/22 1408   ondansetron (ZOFRAN) tablet 4 mg  4 mg Oral Q6H PRN Andris Baumannuncan, Hazel V, MD   4 mg at 10/26/22 0537   Or   ondansetron (ZOFRAN) injection 4 mg  4 mg Intravenous Q6H PRN Andris Baumannuncan, Hazel V, MD       oxybutynin (DITROPAN) tablet 5 mg  5 mg Oral BID Andris Baumannuncan, Hazel V, MD   5 mg at 11/02/22 0859   pantoprazole (PROTONIX) EC tablet 40 mg  40 mg Oral Daily Lindajo Royaluncan, Hazel V, MD   40 mg at 11/02/22 0904   polyethylene glycol (MIRALAX / GLYCOLAX) packet 17 g  17 g Oral Daily Gillis SantaKumar, Dileep, MD   17 g at 11/01/22 0946   sertraline (ZOLOFT) tablet 25 mg  25 mg Oral Daily Andris Baumannuncan, Hazel V, MD   25 mg at 11/02/22 0859   Vitamin D (Ergocalciferol) (DRISDOL) 1.25 MG (50000 UNIT) capsule 50,000 Units  50,000 Units Oral Q7 days Gillis SantaKumar, Dileep, MD   50,000 Units at 10/30/22 1446     Discharge Medications: TAKE these medications     acetaminophen 325 MG tablet Commonly known as: TYLENOL Take 2 tablets by mouth every 6 (six) hours as needed.     allopurinol 100 MG tablet Commonly known as: ZYLOPRIM Take 100 mg by mouth 2 (two) times daily.    amLODipine 5 MG tablet Commonly known as: NORVASC Take 5 mg by mouth daily.    buPROPion 100 MG tablet Commonly known as: WELLBUTRIN Take 100 mg by mouth 2 (two) times daily.    calcium carbonate 1250 (  500 Ca) MG tablet Commonly known as: OS-CAL - dosed in mg of elemental calcium Take 1 tablet by mouth 2 (two) times daily.    cephALEXin 500 MG capsule Commonly known as: KEFLEX Take 1 capsule (500 mg total) by mouth 4 (four) times daily -  with meals and at bedtime for 4 days.    cholecalciferol 25 MCG (1000 UT) tablet Generic drug: Cholecalciferol Take 1 tablet by mouth daily.    cloNIDine 0.2 MG tablet Commonly known as: CATAPRES Take 1 tablet by mouth 3 (three) times daily.    Combivent Respimat 20-100 MCG/ACT Aers respimat Generic drug: Ipratropium-Albuterol Inhale 1 puff into the lungs 4 (four) times daily.    diclofenac Sodium 1 % Gel Commonly known as: VOLTAREN Apply 2 g topically 4 (four) times daily. To both knees and hips.    Docusate Sodium 100 MG capsule Take 100 mg by mouth 2 (two) times daily.    FeroSul 325 (65 FE) MG tablet Generic drug: ferrous sulfate Take 325 mg by mouth 2 (two) times daily.    fluticasone 50 MCG/ACT nasal spray Commonly known as: FLONASE Place 2 sprays into both nostrils daily.    folic acid 1 MG tablet Commonly known as: FOLVITE Take 1 mg by mouth daily.    gabapentin 300 MG capsule Commonly known as: NEURONTIN Take 300 mg by mouth 2 (two) times daily. Take at 1400 and 2000    latanoprost 0.005 % ophthalmic solution Commonly known as: XALATAN Place 1 drop into both eyes at bedtime.    magnesium oxide 400 MG tablet Commonly known as: MAG-OX Take 400 mg by mouth 2 (two) times daily.    metFORMIN 500 MG tablet Commonly known as: GLUCOPHAGE Take 1.5 tablets by mouth 2 (two) times daily.    omeprazole 40 MG  capsule Commonly known as: PRILOSEC Take 40 mg by mouth daily.    oxybutynin 5 MG tablet Commonly known as: DITROPAN Take 1 tablet by mouth 2 (two) times daily.    OXYGEN Inhale 2 L into the lungs as needed.    sertraline 50 MG tablet Commonly known as: ZOLOFT Take 25 mg by mouth daily.    zinc oxide 20 % ointment Apply 1 Application topically every morning.                 Relevant Imaging Results:  Relevant Lab Results:   Additional Information [id number]  Garret Reddish, RN

## 2022-11-02 NOTE — Discharge Summary (Signed)
[facility] Discharge Summary   Patient: Caitlin Hardin ZOX:[id number]  PCP: Ellan Lambert, NP  Date of admission: 10/22/2022   Date of discharge:  11/02/2022     Discharge Diagnoses:  Active Problems:   Cellulitis of left lower extremity   Sepsis   Candidal intertrigo   OSA (obstructive sleep apnea)   Essential hypertension   Depression   Diabetes mellitus type 2, noninsulin dependent   Class 3 severe obesity with body mass index (BMI) of 50.0 to 59.9 in adult   Stage 3a chronic kidney disease   Chronic obstructive pulmonary disease, unspecified   Chronic respiratory failure with hypoxia   Admitted From: ALF Disposition:  ALF/ILF   Recommendations for Outpatient Follow-up:  PCP: in 1 wk Follow up LABS/TEST:  CBC and BMP in 1 wk   Diet recommendation: Cardiac and Carb modified diet  Activity: The patient is advised to gradually reintroduce usual activities, as tolerated  Discharge Condition: stable  Code Status: Full code   History of present illness: As per the H and P dictated on admission  Hospital Course:  Caitlin Hardin is a 57 y.o. female with medical history significant for Class III obesity, BMI over 50, OSA on CPAP, HTN, DM, prior stroke, depression, COPD on home O2 at 2 L, CKD llla who presents to the ED after awakening with left lower extremity pain redness and swelling, which was noticed after sustaining a mechanical fall after waking at 5 AM and going to the bathroom.  She later developed a fever on her way to the hospital.   ED w/up: Tmax 103.1 with pulse 112 and respirations 23, O2 sat 95% on room air and BP 115/60.  Labs notable for WBC 21,000 with lactic acid 1.5.  Respiratory viral panel negative.CMP notable for creatinine 1.25 which is about her baseline and minor electrolyte abnormalities.  Total bilirubin 1.5.  Urinalysis mostly unremarkable. EKG, personally viewed and interpreted showing sinus tachycardia at 110 with no acute ST-T wave  changes. Chest x-ray showed no focal consolidations Left lower extremity Doppler without evidence of DVT Patient was treated with sepsis fluids, started on cefepime and metronidazole and vancomycin. She was also given fluconazole for candidal rash in skin folds.  Hospitalist consulted for admission.      Assessment and Plan: # Cellulitis of left lower extremity Sepsis, criteria include fever, tachypnea, soft blood pressure, leukocytosis lactic acidosis, Lower extremity Doppler negative for DVT. S/p cefepime and Flagyl, DC'd on 4/4 and s/p ceftriaxone 2 g IV daily d/c'd on 4/10. S/p vancomycin for 5 days. S/p IVF for Sepsis  WBC count 21.7---11.8 trending down. Pro-Cal 0.76, ESR 106, CRP 35.7 elevated 4/4 s/p Toradol twice daily for 2 days, continue prn. On 4/9 repeat venous duplex negative for DVT, s/p Lasix 40 mg x 1 dose given, edema improved and on 4/10 Lasix 40 mg x 1 dose given, edema almost resolved.  Patient still has mild tenderness but no pain, ambulating well.  Discharged on Keflex for 4 days more.  Follow with PCP and repeat CBC and BMP after 1 week. # UTI, urine culture growing staph stimulants, pansensitive. S/p ceftriaxone. # Candidal intertrigo, improved, S/p fluconazole  and Skin protective barriers # Chronic obstructive pulmonary disease, unspecified # Chronic respiratory failure with hypoxia, Not acutely exacerbated, continue home inhalers # AKI, baseline cr 0.52--0.86, CKD stage 2, Cr 1.16 elevated, s/p IVF overnight, Cr 0.75 improved # Class 3 severe obesity with body mass index (BMI) of 50.0 to 59.9  in adult  Complicating factor to overall prognosis and care # Diabetes mellitus type 2, noninsulin dependent, s/p Sliding scale insulin coverage, Resumed home meds on dc # Depression, Continue Zoloft and bupropion # Essential hypertension, Held antihypertensives due to soft blood pressures. Bp improved, resumed home meds on dc, monitor BP # OSA (obstructive sleep apnea),  continue CPAP nightly # Iron deficiency anemia, iron level 20, transferrin saturation 7% at lower end, Continue oral iron supplement # Vitamin D insufficiency, vitamin D level 25, cotinue oral supplement.  Body mass index is 52.86 kg/m.  Nutrition Interventions:   Patient was seen by physical therapy, who recommended Home health, which was arranged. On the day of the discharge the patient's vitals were stable, and no other acute medical condition were reported by patient. the patient was felt safe to be discharge at ALF/ILF with Home health.  Consultants: None Procedures: None  Discharge Exam: General: Appear in no distress, no Rash; Oral Mucosa Clear, moist. Cardiovascular: S1 and S2 Present, no Murmur, Respiratory: normal respiratory effort, Bilateral Air entry present and no Crackles, no wheezes Abdomen: Bowel Sound present, Soft and no tenderness, no hernia Extremities: no Pedal edema, no calf tenderness Neurology: alert and oriented to time, place, and person affect appropriate.  Filed Weights   10/22/22 1830 10/22/22 2212  Weight: 116.1 kg 118.7 kg   Vitals:   11/01/22 2331 11/02/22 0728  BP: (!) 157/63 (!) 141/70  Pulse: 78 78  Resp: 17 18  Temp: 98.1 F (36.7 C) 98 F (36.7 C)  SpO2: 96% 96%    DISCHARGE MEDICATION: Allergies as of 11/02/2022   No Known Allergies      Medication List     STOP taking these medications    neomycin-bacitracin-polymyxin [phone] Oint       TAKE these medications    acetaminophen 325 MG tablet Commonly known as: TYLENOL Take 2 tablets by mouth every 6 (six) hours as needed.   allopurinol 100 MG tablet Commonly known as: ZYLOPRIM Take 100 mg by mouth 2 (two) times daily.   amLODipine 5 MG tablet Commonly known as: NORVASC Take 5 mg by mouth daily.   buPROPion 100 MG tablet Commonly known as: WELLBUTRIN Take 100 mg by mouth 2 (two) times daily.   calcium carbonate 1250 (500 Ca) MG tablet Commonly known as:  OS-CAL - dosed in mg of elemental calcium Take 1 tablet by mouth 2 (two) times daily.   cephALEXin 500 MG capsule Commonly known as: KEFLEX Take 1 capsule (500 mg total) by mouth 4 (four) times daily -  with meals and at bedtime for 4 days.   cholecalciferol 25 MCG (1000 UT) tablet Generic drug: Cholecalciferol Take 1 tablet by mouth daily.   cloNIDine 0.2 MG tablet Commonly known as: CATAPRES Take 1 tablet by mouth 3 (three) times daily.   Combivent Respimat 20-100 MCG/ACT Aers respimat Generic drug: Ipratropium-Albuterol Inhale 1 puff into the lungs 4 (four) times daily.   diclofenac Sodium 1 % Gel Commonly known as: VOLTAREN Apply 2 g topically 4 (four) times daily. To both knees and hips.   Docusate Sodium 100 MG capsule Take 100 mg by mouth 2 (two) times daily.   FeroSul 325 (65 FE) MG tablet Generic drug: ferrous sulfate Take 325 mg by mouth 2 (two) times daily.   fluticasone 50 MCG/ACT nasal spray Commonly known as: FLONASE Place 2 sprays into both nostrils daily.   folic acid 1 MG tablet Commonly known as: FOLVITE Take 1 mg by  mouth daily.   gabapentin 300 MG capsule Commonly known as: NEURONTIN Take 300 mg by mouth 2 (two) times daily. Take at 1400 and 2000   latanoprost 0.005 % ophthalmic solution Commonly known as: XALATAN Place 1 drop into both eyes at bedtime.   magnesium oxide 400 MG tablet Commonly known as: MAG-OX Take 400 mg by mouth 2 (two) times daily.   metFORMIN 500 MG tablet Commonly known as: GLUCOPHAGE Take 1.5 tablets by mouth 2 (two) times daily.   omeprazole 40 MG capsule Commonly known as: PRILOSEC Take 40 mg by mouth daily.   oxybutynin 5 MG tablet Commonly known as: DITROPAN Take 1 tablet by mouth 2 (two) times daily.   OXYGEN Inhale 2 L into the lungs as needed.   sertraline 50 MG tablet Commonly known as: ZOLOFT Take 25 mg by mouth daily.   zinc oxide 20 % ointment Apply 1 Application topically every morning.                Durable Medical Equipment  (From admission, onward)           Start     Ordered   10/25/22 1200  For home use only DME Walker rolling  Once       Question Answer Comment  Walker: With 5 Inch Wheels   Patient needs a walker to treat with the following condition Age-related physical debility      10/25/22 1159           No Known Allergies Discharge Instructions     Diet - low sodium heart healthy   Complete by: As directed    Discharge instructions   Complete by: As directed    Follow-up with PCP in 1 week   Increase activity slowly   Complete by: As directed        The results of significant diagnostics from this hospitalization (including imaging, microbiology, ancillary and laboratory) are listed below for reference.    Significant Diagnostic Studies: US Venous Img Lower Unilateral Left (DVT)  Result Date: 10/31/2022 CLINICAL DATA:  Left lower extremity pain and edema. Evaluate for DVT. EXAM: LEFT LOWER EXTREMITY VENOUS DOPPLER ULTRASOUND TECHNIQUE: Gray-scale sonography with graded compression, as well as color Doppler and duplex ultrasound were performed to evaluate the lower extremity deep venous systems from the level of the common femoral vein and including the common femoral, femoral, profunda femoral, popliteal and calf veins including the posterior tibial, peroneal and gastrocnemius veins when visible. The superficial great saphenous vein was also interrogated. Spectral Doppler was utilized to evaluate flow at rest and with distal augmentation maneuvers in the common femoral, femoral and popliteal veins. COMPARISON:  Left lower extremity venous Doppler ultrasound-10/22/2022 (negative) FINDINGS: Contralateral Common Femoral Vein: Respiratory phasicity is normal and symmetric with the symptomatic side. No evidence of thrombus. Normal compressibility. Common Femoral Vein: No evidence of thrombus. Normal compressibility, respiratory phasicity and response  to augmentation. Saphenofemoral Junction: No evidence of thrombus. Normal compressibility and flow on color Doppler imaging. Profunda Femoral Vein: No evidence of thrombus. Normal compressibility and flow on color Doppler imaging. Femoral Vein: No evidence of thrombus. Normal compressibility, respiratory phasicity and response to augmentation. Popliteal Vein: No evidence of thrombus. Normal compressibility, respiratory phasicity and response to augmentation. Calf Veins: No evidence of thrombus. Normal compressibility and flow on color Doppler imaging. Superficial Great Saphenous Vein: No evidence of thrombus. Normal compressibility. Other Findings:  None. IMPRESSION: No evidence of DVT within the left lower extremity. Electronically Signed  By: Simonne Come M.D.   On: 10/31/2022 16:20   US RENAL  Result Date: 10/30/2022 CLINICAL DATA:  AKI EXAM: RENAL / URINARY TRACT ULTRASOUND COMPLETE COMPARISON:  CT renal stone protocol March 25, 2021 FINDINGS: Right Kidney: Renal measurements: 11.3 x 5.2 x 5.9 cm = volume: 180.8 mL. Echogenicity within normal limits. No mass or hydronephrosis visualized. Left Kidney: Renal measurements: 10.0 x 5.0 x 5.2 cm = volume: 138.0 mL. Echogenicity within normal limits. No mass or hydronephrosis visualized. Bladder: The bladder is not well-visualized, likely due to underdistention. Other: None. IMPRESSION: Normal renal ultrasound. Electronically Signed   By: Jacob Moores M.D.   On: 10/30/2022 10:57   CT TIBIA FIBULA LEFT W CONTRAST  Result Date: 10/25/2022 CLINICAL DATA:  Soft tissue infection suspected. Patient presents with left lower leg redness pain. EXAM: CT OF THE LOWER LEFT EXTREMITY WITH CONTRAST TECHNIQUE: Multidetector CT imaging of the lower left extremity was performed according to the standard protocol following intravenous contrast administration. RADIATION DOSE REDUCTION: This exam was performed according to the departmental dose-optimization program which  includes automated exposure control, adjustment of the mA and/or kV according to patient size and/or use of iterative reconstruction technique. CONTRAST:  OMNIPAQUE IOHEXOL 300 MG/ML  SOLN COMPARISON:  None Available. FINDINGS: Bones/Joint/Cartilage No evidence of fracture or osseous lesion. No cortical erosion or periosteal reaction. Medial tibiofemoral joint space narrowing with marginal osteophytes suggesting moderate osteoarthritis. Ligaments Suboptimally assessed by CT. Muscles and Tendons Muscles are normal in bulk. No intramuscular hematoma or fluid collection. Tendons of the flexor, extensor and peroneal compartments are intact. Achilles tendon is intact. Mild Achilles enthesopathy. Soft tissues Skin thickening and subcutaneous soft tissue edema prominent about the lateral aspect of the knee and leg. The findings are consistent with cellulitis. No fluid collection or abscess. IMPRESSION: 1. Skin thickening and subcutaneous soft tissue edema prominent about the lateral aspect of the knee and leg. The findings are consistent with cellulitis. No fluid collection or abscess. 2. No evidence of fracture or osseous lesion. 3. Muscles and tendons are intact. Electronically Signed   By: Larose Hires D.O.   On: 10/25/2022 21:37   US Venous Img Lower Unilateral Left  Result Date: 10/22/2022 CLINICAL DATA:  Left leg erythema and pain EXAM: Choose 2 LOWER EXTREMITY VENOUS DOPPLER ULTRASOUND TECHNIQUE: Gray-scale sonography with compression, as well as color and duplex ultrasound, were performed to evaluate the deep venous system(s) from the level of the common femoral vein through the popliteal and proximal calf veins. COMPARISON:  None Available. FINDINGS: VENOUS Normal compressibility of the common femoral, superficial femoral, and popliteal veins, as well as the visualized calf veins. Visualized portions of profunda femoral vein and great saphenous vein unremarkable. No filling defects to suggest DVT on  grayscale or color Doppler imaging. Doppler waveforms show normal direction of venous flow, normal respiratory plasticity and response to augmentation. Limited views of the contralateral common femoral vein are unremarkable. OTHER Enlarged lymph node within the proximal left thigh measures 4.3 x 3.3 x 1.4 cm. Limitations: none IMPRESSION: 1. No evidence of deep venous thrombosis within the left lower extremity. 2. Enlarged left lymph node within the proximal left thigh, of uncertain significance. Electronically Signed   By: Sharlet Salina M.D.   On: 10/22/2022 19:29   DG Chest Portable 1 View  Result Date: 10/22/2022 CLINICAL DATA:  Sepsis EXAM: PORTABLE CHEST 1 VIEW COMPARISON:  Chest radiograph dated 09/10/2018 FINDINGS: Normal lung volumes. No focal consolidations. No pleural effusion or  pneumothorax. Enlarged cardiomediastinal silhouette is likely projectional. The visualized skeletal structures are unremarkable. IMPRESSION: 1.  No focal consolidations. 2. Enlarged cardiomediastinal silhouette is likely projectional. Electronically Signed   By: Agustin Cree M.D.   On: 10/22/2022 19:02    Microbiology: No results found for this or any previous visit (from the past 240 hour(s)).   Labs: CBC: Recent Labs  Lab 10/29/22 0447 10/30/22 0750 10/31/22 0530 11/01/22 0857 11/02/22 0452  WBC 14.7* 14.8* 11.8* 10.9* 11.8*  HGB 9.5* 10.2* 10.2* 10.6* 10.6*  HCT 29.7* 32.1* 31.4* 33.8* 33.3*  MCV 95.2 95.5 95.2 96.8 96.0  PLT 294 365 341 358 364   Basic Metabolic Panel: Recent Labs  Lab 10/29/22 0447 10/30/22 0418 10/31/22 0530 11/01/22 0857 11/02/22 0452  NA 137 138 140 138 137  K 3.9 4.6 4.2 4.6 4.4  CL 102 111 105 99 99  CO2 29 22 30  32 30  GLUCOSE 114* 59* 113* 188* 119*  BUN 10 31* 9 11 15   CREATININE 0.53 1.16* 0.67 0.76 0.75  CALCIUM 8.2* 8.4* 8.5* 9.2 9.1  MG  --   --  2.2  --  2.2  PHOS  --   --  4.7*  --  4.7*   Liver Function Tests: No results for input(s): "AST", "ALT",  "ALKPHOS", "BILITOT", "PROT", "ALBUMIN" in the last 168 hours. No results for input(s): "LIPASE", "AMYLASE" in the last 168 hours. No results for input(s): "AMMONIA" in the last 168 hours. Cardiac Enzymes: No results for input(s): "CKTOTAL", "CKMB", "CKMBINDEX", "TROPONINI" in the last 168 hours. BNP (last 3 results) No results for input(s): "BNP" in the last 8760 hours. CBG: Recent Labs  Lab 11/01/22 1147 11/01/22 1733 11/01/22 2211 11/02/22 0835 11/02/22 1145  GLUCAP 109* 106* 112* 187* 117*    Time spent: 35 minutes  Signed:  Gillis Santa  [facility]  11/02/2022 11:56 AM

## 2022-11-02 NOTE — Progress Notes (Signed)
Discharged. AVS printed placed in packet reviewed with Tammy from [facility]. All belongings gathered. [facility] transportation to pick up at 230p.

## 2024-08-29 ENCOUNTER — Other Ambulatory Visit: Payer: Self-pay

## 2024-08-29 ENCOUNTER — Emergency Department: Admission: EM | Admit: 2024-08-29 | Source: Home / Self Care

## 2024-08-29 LAB — CBC
HCT: 37.2 % (ref 36.0–46.0)
Hemoglobin: 12 g/dL (ref 12.0–15.0)
MCH: 30.9 pg (ref 26.0–34.0)
MCHC: 32.3 g/dL (ref 30.0–36.0)
MCV: 95.9 fL (ref 80.0–100.0)
Platelets: 235 10*3/uL (ref 150–400)
RBC: 3.88 MIL/uL (ref 3.87–5.11)
RDW: 14 % (ref 11.5–15.5)
WBC: 7.7 10*3/uL (ref 4.0–10.5)
nRBC: 0 % (ref 0.0–0.2)

## 2024-08-29 LAB — COMPREHENSIVE METABOLIC PANEL WITH GFR
ALT: 36 U/L (ref 0–44)
AST: 24 U/L (ref 15–41)
Albumin: 4.2 g/dL (ref 3.5–5.0)
Alkaline Phosphatase: 92 U/L (ref 38–126)
Anion gap: 15 (ref 5–15)
BUN: 10 mg/dL (ref 6–20)
CO2: 26 mmol/L (ref 22–32)
Calcium: 9.2 mg/dL (ref 8.9–10.3)
Chloride: 102 mmol/L (ref 98–111)
Creatinine, Ser: 0.84 mg/dL (ref 0.44–1.00)
GFR, Estimated: >60 mL/min
Glucose, Bld: 125 mg/dL — ABNORMAL HIGH (ref 70–99)
Potassium: 4.6 mmol/L (ref 3.5–5.1)
Sodium: 143 mmol/L (ref 135–145)
Total Bilirubin: 0.2 mg/dL (ref 0.0–1.2)
Total Protein: 7.7 g/dL (ref 6.5–8.1)

## 2024-08-29 LAB — LIPASE, BLOOD: Lipase: 20 U/L (ref 11–51)

## 2024-08-29 NOTE — ED Triage Notes (Addendum)
 First Nurse Note: Pt presents from [facility] with [facility] for abdominal pain. 10/10. Started today 0100. Endorsing diarrhea without nausea or vomiting.  BP: 150/65, HR 87, 97% RA
